# Patient Record
Sex: Male | Born: 1965 | Race: Asian | Hispanic: No | Marital: Single | Smoking: Light tobacco smoker
Health system: Southern US, Community
[De-identification: ages and names within clinical notes are randomized; demographics above are authoritative.]

## PROBLEM LIST (undated history)

## (undated) DIAGNOSIS — I10 Essential (primary) hypertension: Secondary | ICD-10-CM

## (undated) DIAGNOSIS — Z992 Dependence on renal dialysis: Secondary | ICD-10-CM

## (undated) DIAGNOSIS — H9190 Unspecified hearing loss, unspecified ear: Secondary | ICD-10-CM

## (undated) DIAGNOSIS — N186 End stage renal disease: Secondary | ICD-10-CM

## (undated) DIAGNOSIS — R7611 Nonspecific reaction to tuberculin skin test without active tuberculosis: Secondary | ICD-10-CM

## (undated) DIAGNOSIS — L709 Acne, unspecified: Secondary | ICD-10-CM

## (undated) DIAGNOSIS — J189 Pneumonia, unspecified organism: Secondary | ICD-10-CM

## (undated) DIAGNOSIS — Z86010 Personal history of colonic polyps: Secondary | ICD-10-CM

## (undated) DIAGNOSIS — D649 Anemia, unspecified: Secondary | ICD-10-CM

## (undated) DIAGNOSIS — F32A Depression, unspecified: Secondary | ICD-10-CM

## (undated) DIAGNOSIS — F419 Anxiety disorder, unspecified: Secondary | ICD-10-CM

## (undated) DIAGNOSIS — I499 Cardiac arrhythmia, unspecified: Secondary | ICD-10-CM

## (undated) DIAGNOSIS — F329 Major depressive disorder, single episode, unspecified: Secondary | ICD-10-CM

## (undated) DIAGNOSIS — A159 Respiratory tuberculosis unspecified: Secondary | ICD-10-CM

## (undated) DIAGNOSIS — N289 Disorder of kidney and ureter, unspecified: Secondary | ICD-10-CM

## (undated) HISTORY — DX: Personal history of colonic polyps: Z86.010

## (undated) HISTORY — DX: Anxiety disorder, unspecified: F41.9

## (undated) HISTORY — DX: Cardiac arrhythmia, unspecified: I49.9

## (undated) HISTORY — DX: Acne, unspecified: L70.9

## (undated) HISTORY — DX: Major depressive disorder, single episode, unspecified: F32.9

## (undated) HISTORY — PX: OTHER SURGICAL HISTORY: SHX169

## (undated) HISTORY — DX: Depression, unspecified: F32.A

## (undated) HISTORY — DX: Pneumonia, unspecified organism: J18.9

---

## 2012-03-20 HISTORY — PX: LIPOMA EXCISION: SHX5283

## 2013-09-24 ENCOUNTER — Other Ambulatory Visit: Payer: Self-pay

## 2013-09-25 ENCOUNTER — Encounter (HOSPITAL_COMMUNITY): Payer: Self-pay | Admitting: Emergency Medicine

## 2013-09-25 DIAGNOSIS — I120 Hypertensive chronic kidney disease with stage 5 chronic kidney disease or end stage renal disease: Principal | ICD-10-CM | POA: Diagnosis present

## 2013-09-25 DIAGNOSIS — H9190 Unspecified hearing loss, unspecified ear: Secondary | ICD-10-CM | POA: Diagnosis present

## 2013-09-25 DIAGNOSIS — D631 Anemia in chronic kidney disease: Secondary | ICD-10-CM | POA: Diagnosis present

## 2013-09-25 DIAGNOSIS — E8779 Other fluid overload: Secondary | ICD-10-CM | POA: Diagnosis present

## 2013-09-25 DIAGNOSIS — I498 Other specified cardiac arrhythmias: Secondary | ICD-10-CM | POA: Diagnosis present

## 2013-09-25 DIAGNOSIS — N039 Chronic nephritic syndrome with unspecified morphologic changes: Secondary | ICD-10-CM

## 2013-09-25 DIAGNOSIS — N186 End stage renal disease: Secondary | ICD-10-CM | POA: Diagnosis present

## 2013-09-25 DIAGNOSIS — R7611 Nonspecific reaction to tuberculin skin test without active tuberculosis: Secondary | ICD-10-CM | POA: Diagnosis present

## 2013-09-25 DIAGNOSIS — D696 Thrombocytopenia, unspecified: Secondary | ICD-10-CM | POA: Diagnosis present

## 2013-09-25 DIAGNOSIS — E875 Hyperkalemia: Secondary | ICD-10-CM | POA: Diagnosis present

## 2013-09-25 NOTE — ED Notes (Signed)
Pt speak Burmese, dialect is Matu. Family with pt

## 2013-09-25 NOTE — ED Notes (Signed)
Pt just flew in from [place]. Pt was transported from airport via EMS strictly for transport. Pt is a dialysis pt and seeking further treatment in the states as a refugee. A representative from the [employer] will be coming to see pt in ED and explain in further detail pt's needs. Pt has MD from [place] at bedside. Pt received dialysis yesterday and one unit of PRBC. Pt is [place]ell apparent distress.

## 2013-09-26 ENCOUNTER — Inpatient Hospital Stay (HOSPITAL_COMMUNITY): Payer: Medicaid Other

## 2013-09-26 ENCOUNTER — Inpatient Hospital Stay (HOSPITAL_COMMUNITY)
Admission: EM | Admit: 2013-09-26 | Discharge: 2013-10-09 | DRG: 673 | Disposition: A | Discharge status: Home / Self Care | Payer: Medicaid Other | Attending: Internal Medicine | Admitting: Internal Medicine

## 2013-09-26 ENCOUNTER — Encounter (HOSPITAL_COMMUNITY): Payer: Self-pay | Admitting: Internal Medicine

## 2013-09-26 DIAGNOSIS — I10 Essential (primary) hypertension: Secondary | ICD-10-CM

## 2013-09-26 DIAGNOSIS — H9190 Unspecified hearing loss, unspecified ear: Secondary | ICD-10-CM

## 2013-09-26 DIAGNOSIS — E875 Hyperkalemia: Secondary | ICD-10-CM | POA: Diagnosis present

## 2013-09-26 DIAGNOSIS — R1011 Right upper quadrant pain: Secondary | ICD-10-CM

## 2013-09-26 DIAGNOSIS — I498 Other specified cardiac arrhythmias: Secondary | ICD-10-CM

## 2013-09-26 DIAGNOSIS — D696 Thrombocytopenia, unspecified: Secondary | ICD-10-CM

## 2013-09-26 DIAGNOSIS — R7611 Nonspecific reaction to tuberculin skin test without active tuberculosis: Secondary | ICD-10-CM | POA: Diagnosis present

## 2013-09-26 DIAGNOSIS — N186 End stage renal disease: Secondary | ICD-10-CM

## 2013-09-26 DIAGNOSIS — I120 Hypertensive chronic kidney disease with stage 5 chronic kidney disease or end stage renal disease: Secondary | ICD-10-CM | POA: Diagnosis present

## 2013-09-26 DIAGNOSIS — D649 Anemia, unspecified: Secondary | ICD-10-CM

## 2013-09-26 DIAGNOSIS — D638 Anemia in other chronic diseases classified elsewhere: Secondary | ICD-10-CM | POA: Diagnosis present

## 2013-09-26 DIAGNOSIS — R001 Bradycardia, unspecified: Secondary | ICD-10-CM | POA: Diagnosis present

## 2013-09-26 DIAGNOSIS — E8779 Other fluid overload: Secondary | ICD-10-CM | POA: Diagnosis present

## 2013-09-26 DIAGNOSIS — H9191 Unspecified hearing loss, right ear: Secondary | ICD-10-CM | POA: Diagnosis present

## 2013-09-26 DIAGNOSIS — Z992 Dependence on renal dialysis: Secondary | ICD-10-CM

## 2013-09-26 DIAGNOSIS — H9192 Unspecified hearing loss, left ear: Secondary | ICD-10-CM | POA: Diagnosis present

## 2013-09-26 DIAGNOSIS — A159 Respiratory tuberculosis unspecified: Secondary | ICD-10-CM | POA: Diagnosis present

## 2013-09-26 DIAGNOSIS — D631 Anemia in chronic kidney disease: Secondary | ICD-10-CM | POA: Diagnosis present

## 2013-09-26 HISTORY — DX: Respiratory tuberculosis unspecified: A15.9

## 2013-09-26 HISTORY — DX: Anemia, unspecified: D64.9

## 2013-09-26 HISTORY — DX: End stage renal disease: N18.6

## 2013-09-26 HISTORY — DX: Nonspecific reaction to tuberculin skin test without active tuberculosis: R76.11

## 2013-09-26 HISTORY — DX: Essential (primary) hypertension: I10

## 2013-09-26 HISTORY — DX: Disorder of kidney and ureter, unspecified: N28.9

## 2013-09-26 HISTORY — DX: Unspecified hearing loss, unspecified ear: H91.90

## 2013-09-26 HISTORY — DX: Dependence on renal dialysis: Z99.2

## 2013-09-26 LAB — HEPATIC FUNCTION PANEL
ALK PHOS: 53 U/L (ref 39–117)
ALT: 9 U/L (ref 0–53)
AST: 8 U/L (ref 0–37)
Albumin: 3.5 g/dL (ref 3.5–5.2)
BILIRUBIN TOTAL: 0.4 mg/dL (ref 0.3–1.2)
Bilirubin, Direct: <0.2 mg/dL (ref 0.0–0.3)
TOTAL PROTEIN: 6.6 g/dL (ref 6.0–8.3)

## 2013-09-26 LAB — HEPATITIS PANEL, ACUTE
HCV Ab: NEGATIVE
HEP B C IGM: NONREACTIVE
Hep A IgM: NONREACTIVE
Hepatitis B Surface Ag: NEGATIVE

## 2013-09-26 LAB — RENAL FUNCTION PANEL
ALBUMIN: 3.2 g/dL — AB (ref 3.5–5.2)
Anion gap: 21 — ABNORMAL HIGH (ref 5–15)
BUN: 62 mg/dL — AB (ref 6–23)
CALCIUM: 9.9 mg/dL (ref 8.4–10.5)
CHLORIDE: 101 meq/L (ref 96–112)
CO2: 20 mEq/L (ref 19–32)
Creatinine, Ser: 12.04 mg/dL — ABNORMAL HIGH (ref 0.50–1.35)
GFR calc Af Amer: 5 mL/min — ABNORMAL LOW (ref >90)
GFR calc non Af Amer: 4 mL/min — ABNORMAL LOW (ref >90)
Glucose, Bld: 69 mg/dL — ABNORMAL LOW (ref 70–99)
Phosphorus: 5.7 mg/dL — ABNORMAL HIGH (ref 2.3–4.6)
Potassium: 5 mEq/L (ref 3.7–5.3)
Sodium: 142 mEq/L (ref 137–147)

## 2013-09-26 LAB — CBC
HCT: 26.6 % — ABNORMAL LOW (ref 39.0–52.0)
HCT: 25.3 % — ABNORMAL LOW (ref 39.0–52.0)
Hemoglobin: 8.8 g/dL — ABNORMAL LOW (ref 13.0–17.0)
Hemoglobin: 8.3 g/dL — ABNORMAL LOW (ref 13.0–17.0)
MCH: 29 pg (ref 26.0–34.0)
MCH: 28.4 pg (ref 26.0–34.0)
MCHC: 33.1 g/dL (ref 30.0–36.0)
MCHC: 32.8 g/dL (ref 30.0–36.0)
MCV: 87.8 fL (ref 78.0–100.0)
MCV: 86.6 fL (ref 78.0–100.0)
Platelets: 91 10*3/uL — ABNORMAL LOW (ref 150–400)
Platelets: 94 10*3/uL — ABNORMAL LOW (ref 150–400)
RBC: 3.03 MIL/uL — ABNORMAL LOW (ref 4.22–5.81)
RBC: 2.92 MIL/uL — ABNORMAL LOW (ref 4.22–5.81)
RDW: 15.4 % (ref 11.5–15.5)
RDW: 14.9 % (ref 11.5–15.5)
WBC: 6.2 10*3/uL (ref 4.0–10.5)
WBC: 6 10*3/uL (ref 4.0–10.5)

## 2013-09-26 LAB — BASIC METABOLIC PANEL
Anion gap: 18 — ABNORMAL HIGH (ref 5–15)
BUN: 57 mg/dL — AB (ref 6–23)
CALCIUM: 10.3 mg/dL (ref 8.4–10.5)
CO2: 21 meq/L (ref 19–32)
CREATININE: 11.1 mg/dL — AB (ref 0.50–1.35)
Chloride: 101 mEq/L (ref 96–112)
GFR calc Af Amer: 5 mL/min — ABNORMAL LOW (ref >90)
GFR calc non Af Amer: 5 mL/min — ABNORMAL LOW (ref >90)
GLUCOSE: 81 mg/dL (ref 70–99)
Potassium: 5.6 mEq/L — ABNORMAL HIGH (ref 3.7–5.3)
Sodium: 140 mEq/L (ref 137–147)

## 2013-09-26 LAB — IRON AND TIBC
IRON: 103 ug/dL (ref 42–135)
SATURATION RATIOS: 54 % (ref 20–55)
TIBC: 190 ug/dL — AB (ref 215–435)
UIBC: 87 ug/dL — ABNORMAL LOW (ref 125–400)

## 2013-09-26 LAB — FERRITIN: Ferritin: 938 ng/mL — ABNORMAL HIGH (ref 22–322)

## 2013-09-26 LAB — HIV ANTIBODY (ROUTINE TESTING W REFLEX): HIV 1&2 Ab, 4th Generation: NONREACTIVE

## 2013-09-26 MED ORDER — SODIUM CHLORIDE 0.9 % IJ SOLN
3.0000 mL | Freq: Two times a day (BID) | INTRAMUSCULAR | Status: DC
  Administered 2013-09-26 – 2013-10-09 (×23): 3 mL via INTRAVENOUS

## 2013-09-26 MED ORDER — SODIUM CHLORIDE 0.9 % IV SOLN: 100.0000 mL | INTRAVENOUS | Status: DC | PRN

## 2013-09-26 MED ORDER — LIDOCAINE HCL (PF) 1 % IJ SOLN: 5.0000 mL | INTRAMUSCULAR | Status: DC | PRN

## 2013-09-26 MED ORDER — NEPRO/CARBSTEADY PO LIQD: 237.0000 mL | ORAL | Status: DC | PRN

## 2013-09-26 MED ORDER — LIDOCAINE-PRILOCAINE 2.5-2.5 % EX CREA: 1.0000 "application " | TOPICAL_CREAM | CUTANEOUS | Status: DC | PRN

## 2013-09-26 MED ORDER — AMLODIPINE BESYLATE 5 MG PO TABS
10.0000 mg | ORAL_TABLET | Freq: Once | ORAL | Status: AC
  Administered 2013-09-26: 10 mg via ORAL
  Filled 2013-09-26: qty 2

## 2013-09-26 MED ORDER — DARBEPOETIN ALFA-POLYSORBATE 60 MCG/0.3ML IJ SOLN
60.0000 ug | INTRAMUSCULAR | Status: DC
  Filled 2013-09-26: qty 0.3

## 2013-09-26 MED ORDER — HEPARIN SODIUM (PORCINE) 5000 UNIT/ML IJ SOLN
5000.0000 [IU] | Freq: Three times a day (TID) | INTRAMUSCULAR | Status: DC
  Filled 2013-09-26 (×3): qty 1

## 2013-09-26 MED ORDER — HYDRALAZINE HCL 20 MG/ML IJ SOLN: 10.0000 mg | Freq: Once | INTRAMUSCULAR | Status: DC

## 2013-09-26 MED ORDER — HEPARIN SODIUM (PORCINE) 1000 UNIT/ML DIALYSIS
1000.0000 [IU] | INTRAMUSCULAR | Status: DC | PRN
  Filled 2013-09-26: qty 1

## 2013-09-26 MED ORDER — HEPARIN SODIUM (PORCINE) 1000 UNIT/ML DIALYSIS
2000.0000 [IU] | INTRAMUSCULAR | Status: DC | PRN
  Filled 2013-09-26: qty 2

## 2013-09-26 MED ORDER — ACETAMINOPHEN 650 MG RE SUPP: 650.0000 mg | Freq: Four times a day (QID) | RECTAL | Status: DC | PRN

## 2013-09-26 MED ORDER — ALTEPLASE 2 MG IJ SOLR
2.0000 mg | Freq: Once | INTRAMUSCULAR | Status: DC | PRN
  Filled 2013-09-26: qty 2

## 2013-09-26 MED ORDER — ACETAMINOPHEN 325 MG PO TABS
650.0000 mg | ORAL_TABLET | Freq: Four times a day (QID) | ORAL | Status: DC | PRN
  Administered 2013-09-27 – 2013-10-02 (×4): 650 mg via ORAL
  Filled 2013-09-26 (×3): qty 2

## 2013-09-26 MED ORDER — HYDRALAZINE HCL 20 MG/ML IJ SOLN
5.0000 mg | Freq: Four times a day (QID) | INTRAMUSCULAR | Status: DC | PRN
  Administered 2013-09-26: 5 mg via INTRAVENOUS
  Filled 2013-09-26: qty 1

## 2013-09-26 MED ORDER — PENTAFLUOROPROP-TETRAFLUOROETH EX AERO: 1.0000 "application " | INHALATION_SPRAY | CUTANEOUS | Status: DC | PRN

## 2013-09-26 MED ORDER — TUBERCULIN PPD 5 UNIT/0.1ML ID SOLN
5.0000 [IU] | Freq: Once | INTRADERMAL | Status: AC
  Administered 2013-09-26: 5 [IU] via INTRADERMAL
  Filled 2013-09-26: qty 0.1

## 2013-09-26 NOTE — ED Notes (Signed)
Lab to add on HFP

## 2013-09-26 NOTE — ED Notes (Signed)
While speaking with the pt, his heart rate increased to 63. When resting the pt's heart rate decreases back down to mid 40's.

## 2013-09-26 NOTE — Progress Notes (Signed)
Admission note: Patient came from ED on stretcher accompanied by two staff members. Mental Orientation: A &O x 4 Telemetry: Patient is on cardiac monitoring and CCMD notified. Assessment: See doc flowsheets. Skin:  Warm, dry and intact.  IV: Peripheral IV on Right AC. Pain: Denied any pain.  Showed the faces chart. Safety Measures, Fall Prevention Safety Plan, and admission Screening unable to finish due to language barrier and also patient being in dialysis. 6700 Orientation: Patient has been oriented to the unit, staff and to the room.

## 2013-09-26 NOTE — ED Notes (Signed)
Pain left shoulder, speaks Matu (Bermeise), Ronny Bacon (emergency contact),  Last HD Wednesday.  Pt has been SB 42--Pre-flight screening makes him eligible for Assisted Living and will need a physician to state whether he needs AL or he can stay with friends.

## 2013-09-26 NOTE — Progress Notes (Signed)
Patient speaks Matupi but also Burmese and understands phone Burmese interpreter well.

## 2013-09-26 NOTE — ED Notes (Signed)
PT contact Keith Vaughan [phone]

## 2013-09-26 NOTE — H&P (Signed)
Date: 09/26/2013               Patient Name:  Keith Vaughan MRN: [id number]  DOB: 08/07/1965 Age / Sex: 48 y.o., male   PCP: Bartolini Pcp Per Patient         Medical Service: Internal Medicine Teaching Service         Attending Physician: Dr. Karren Cobble, MD    First Contact: Dr. Arcelia Jew Pager: [phone]  Second Contact: Dr. Algis Liming Pager: [phone]       After Hours (After 5p/  First Contact Pager: [phone]  weekends / holidays): Second Contact Pager: [phone]   Chief Complaint: Need dialysis  History of Present Illness:  Keith Vaughan is a 48 year old man Burmese and [place] speaker with PMH of HTN, ESRD on HD 3 times per week, who immigrated last night from [place] via [place] where he had been a refugee since 2006. The information in this H&P comes from interview with him with a Burmese phone interpreter, from medical chart sent from [place], and from the ED provider. He has ESRD suspected to be secondary to hypertension and has been on dialysis since December 2014. Last night he was brought [facility] from the airport via EMS shortly after arriving in the [place]. He was last dialyzed on 7/9 in [place] and received 2 units of pRBC at that time. Per the ED notes, it appears that this organization is willing to help support Keith Vaughan while he receives dialysis.    He was bradycardic in the ED but is asymptomatic. He complained of right neck pain which he attributed to neck strain from sitting in the airplane. He has paternal relatives here in [place] and wishes to establish permanent residence here but he is not planning to live with his relatives.   In reviewing his records from [place], he has been evaluated as recent as July 6th and was found to have Hgb of 6.6 at that time. He was evaluated by Dr. Juanda Crumble from the [employer] and was deemed independent of his ADLs and fit to travel on July 9th.   Dr. Jimmy Footman, in Nephrology was contacted by the ED physician and  recommended that the patient be admitted to the IMTS for dialysis, placement of permanent HD access, and possible placement in ALF.   Medications prior to his admission:  Enalapril 10mg  1 tablet PO daily Amlodipine 10mg  1 tablet PO daily Calcium carbonate 500mg  tablet PO TID Ferrous Fumarate 200mg  tablet BID Folic acid 5mg  tablet PO daily Vitamin C 100mg  tablet PO daily Vitamin B Complex 1 tablet daily   Meds: Current Facility-Administered Medications  Medication Dose Route Frequency Provider Last Rate Last Dose  . 0.9 %  sodium chloride infusion  100 mL Intravenous PRN Jay K. [name], MD      . 0.9 %  sodium chloride infusion  100 mL Intravenous PRN Ulice Dash K. [name], MD      . acetaminophen (TYLENOL) tablet 650 mg  650 mg Oral Q6H PRN Blain Pais, MD       Or  . acetaminophen (TYLENOL) suppository 650 mg  650 mg Rectal Q6H PRN Blain Pais, MD      . alteplase (CATHFLO ACTIVASE) injection 2 mg  2 mg Intracatheter Once PRN Ulice Dash K. [name], MD      . darbepoetin [facility]) injection 60 mcg  60 mcg Intravenous Q Fri-HD Jay K. [name], MD      . feeding supplement (  NEPRO CARB STEADY) liquid 237 mL  237 mL Oral PRN Ulice Dash K. [name], MD      . heparin injection 1,000 Units  1,000 Units Dialysis PRN [name]. [name], MD      . heparin injection 2,000 Units  2,000 Units Dialysis PRN [name]. [name], MD      . heparin injection 5,000 Units  5,000 Units Subcutaneous 3 times per day Blain Pais, MD      . lidocaine (PF) (XYLOCAINE) 1 % injection 5 mL  5 mL Intradermal PRN [name]. [name], MD      . lidocaine-prilocaine (EMLA) cream 1 application  1 application Topical PRN Ulice Dash K. [name], MD      . pentafluoroprop-tetrafluoroeth Landry Dyke) aerosol 1 application  1 application Topical PRN [name]. [name], MD      . sodium chloride 0.9 % injection 3 mL  3 mL Intravenous Q12H Blain Pais, MD      . tuberculin injection 5 Units  5 Units Intradermal Once Blain Pais, MD         Allergies: Allergies as of 09/25/2013  . ([place]earn Known Allergies)   Past Medical History  Diagnosis Date  . Dialysis patient   . Hypertension   . Anemia   . S/P hemodialysis catheter insertion     Right Internal Jugular   . Hearing loss   . Renal disorder   . ESRF (end stage renal failure)    History reviewed. [place] pertinent past surgical history. History reviewed. [place] pertinent family history. History   Social History  . Marital Status: Single    Spouse Name: N/A    Number of Children: N/A  . Years of Education: N/A   Occupational History  . Not on file.   Social History Main Topics  . Smoking status: Former Research scientist (life sciences)  . Smokeless tobacco: Never Used  . Alcohol Use: [place]ribner  . Drug Use: [place]  . Sexual Activity: Not on file   Other Topics Concern  . Not on file   Social History Narrative  . Vanderslice narrative on file    Review of Systems: Review of Systems  Constitutional: Positive for chills. Negative for fever, weight loss, malaise/fatigue and diaphoresis.  HENT: Positive for hearing loss. Negative for congestion.   Eyes: Negative for blurred vision.  Respiratory: Negative for cough, sputum production and shortness of breath.   Cardiovascular: Positive for leg swelling. Negative for chest pain and palpitations.  Gastrointestinal: Negative for nausea, vomiting, abdominal pain, diarrhea, constipation and blood in stool.  Genitourinary: Negative for dysuria.  Musculoskeletal: Positive for myalgias.       Right upper back/neck pain  Skin: Negative for rash.  Neurological: Negative for dizziness, focal weakness, weakness and headaches.  Psychiatric/Behavioral: Negative for depression.    Physical Exam: Blood pressure 177/96, pulse 56, resp. rate 15, SpO2 99.00%. Physical Exam  Nursing note and vitals reviewed. Constitutional: He is oriented to person, place, and time. He appears well-developed and well-nourished. Behrend distress.  Appears much younger than stated age. Tired  appearing. Conversing in [place]:  Head: Normocephalic and atraumatic.  Mouth/Throat: Oropharynx is clear and moist. Hagger oropharyngeal exudate.  Poor dentition. Dental staining 2/2 hx of betel nut chewing.  Right IJ HD catheter in place, covered with dressing that is c/d/i, with Messmer surrounding edema or erythema.    Eyes: Conjunctivae and EOM are normal. Pupils are equal, round, and reactive to light. Right eye exhibits Houchins discharge. Left eye exhibits Igou  discharge. Mcdougle scleral icterus.  Neck: Normal range of motion. Neck supple.  Cardiovascular: Normal rate and regular rhythm.   Pantaleon murmur heard. Respiratory: Effort normal and breath sounds normal. Sen respiratory distress. He has Escajeda wheezes. He has Detert rales.  GI: Soft. Bowel sounds are normal. He exhibits Stryker distension. There is tenderness. There is Baxley rebound and Basque guarding.  RUQ tenderness to deep palpation.   Musculoskeletal: Normal range of motion. He exhibits Endres edema and Kutner tenderness.  Neurological: He is alert and oriented to person, place, and time. Bonfiglio cranial nerve deficit.  Strength 5/5 in UE and LE  Skin: Skin is warm and dry. Drenning rash noted. He is not diaphoretic. Carvin erythema. Virgo pallor.  Tattoos noted in left forearm and both arms  Psychiatric: He has a normal mood and affect.     Lab results: Basic Metabolic Panel:  Recent Labs  09/26/13 0504  NA 140  K 5.6*  CL 101  CO2 21  GLUCOSE 81  BUN 57*  CREATININE 11.10*  CALCIUM 10.3   Liver Function Tests:  Recent Labs  09/26/13 0504  AST 8  ALT 9  ALKPHOS 53  BILITOT 0.4  PROT 6.6  ALBUMIN 3.5    CBC:  Recent Labs  09/26/13 0504  WBC 6.2  HGB 8.8*  HCT 26.6*  MCV 87.8  PLT 91*     Imaging results:  Dg Chest 2 View  09/26/2013   CLINICAL DATA:  Eval for HD cath  EXAM: CHEST  2 VIEW  COMPARISON:  None.  FINDINGS: Low lung volumes. Cardiac silhouette is enlarged. A right internal jugular sheath is appreciated tip superior vena cava. Coffie  pneumothorax. There is diffuse prominence of interstitial markings and central peribronchial cuffing. Mesa focal region of consolidation nor focal infiltrates. Bernet acute osseous abnormalities.  IMPRESSION: Interstitial findings consistent with pulmonary edema.  Deshaies focal regions of consolidation.  Demmer pneumothorax.  Cardiomegaly.   Electronically Signed   By: [name] M.D.   On: 09/26/2013 11:31   US Abdomen Complete  09/26/2013   CLINICAL DATA:  Eval RUQ, history of end-stage renal disease receiving dialysis  EXAM: ULTRASOUND ABDOMEN COMPLETE  COMPARISON:  None.  FINDINGS: Gallbladder:  Eunice gallstones. Gallbladder wall is slightly prominent measuring 3.3 mm in thickness. Gruhn pericholecystic fluid nor a sonographic Murphy sign.  Common bile duct:  Diameter: 4.4 mm  Liver:  Pischke focal lesion identified. Within normal limits in parenchymal echogenicity.  IVC:  Girardin abnormality visualized.  Pancreas:  Visualized portion unremarkable.  Spleen:  Size and appearance within normal limits.  Right Kidney:  Length: 7.6 cm. Decreased corticomedullary differentiation and diffuse increased cortical echogenicity. Moser hydronephrosis. Cormany gross evidence of solid masses.  Left Kidney:  Length: 6.26 cm. Decreased corticomedullary differentiation, diffuse increased cortical echogenicity and Lamba evidence of hydronephrosis. Bose gross evidence of solid masses.  Abdominal aorta:  Mirando aneurysm visualized.  Other findings:  None.  IMPRESSION: Gallbladder wall prominence without further sonographic evidence of cholecystitis. Correlation with biliary function tests recommended if clinically warranted.  Bilateral renal atrophy with sonographic findings consistent with patient's history of end-stage renal disease.   Electronically Signed   By: [name] M.D.   On: 09/26/2013 12:13     Assessment & Plan by Problem: 48 year old man, refugee from [place], [place] speaker, with PMH of ESRD on HD 3 times weekly and HTN presenting to establish  care in [place]  ESRD on HD - The patient is a  refugee from [place], with last HD July 8th prior to his arrival to the [place].Per medical records from [place], his first HD was in December 2014 at [facility] likely due to uncontrolled hypertension. He has been getting HD 3 times per week for 4 hour session with 1.42m2 polysulfone dialyzer, blood flow 343ml/min, heparin 2000 u prime 1000uper hour, HCO2 dialysate and dry weight of 55 Kg. His vascular access has been Right IJ which was placed in December 2014. He was supposed to get AVF but was unable to get surgery due to "small veins". Last HD screening labs in 6/30 drawn at [facility] with HIV 1/2 non reactive, hep B antigen nonreactive, hep B antibody >1000.0 mIU/ml, anti-HCV non reactive. CXR from March 5th 2015 with "right IJ cath in-situ with borderline cardiomegaly". CXR here with Hazzard focal region of consolidation nor focal infiltrates, interstitial findings consistent with pulmonary edema.    -Admit to telemetry -Nephrology consulted, appreciate help with inpatient HD and setting up outpatient HD -Renal diet--pt prefers rice and vegetables and Nemetz beef or pork products -Vascular Surgery consult for AVF placement -Repeat HIV, Hepatitis B, place PPD  HTN - BP as high as 200/116 in the ED but trended down to 177/96. His usual antihypertensive regimen include amlodipine 10mg  daily and enalapril 10mg  daily but it is unclear when he last took his medications. Volume overload with Pulmonary edema per CXR likely also worsening his HTN.  -Continue amlodipine 10mg  daily  -Hydralazine 5mg  PRN for SPB >180  Abdominal tenderness, RUQ - Mild. Abdominal US with negative Murphy's sign, normal liver, pancreas (partially visualized), and common bile duct but with gallbladder wall prominence without further sonographic evidence of cholecystitis. He denies N/V/D and is tolerating food well. He is afebrile with Baxendale leucocytosis.  Cholecystitis unlikely at this time.  -Continue monitoring  Anemia of chronic disease - Last Hg on 7/6 at 6.6, per [place] medical report it appears that he had been receiving Erythropoietin 6000 U once per week since Jun 22 and received 2 units of pRBC during his last HD session in July 9th. His CBC here reveal Hg of 8.8. He is on ferrous fumarate oral supplementation as well.  -CBC daily -Will follow Nephrology recs on iron supplementation  Thrombocytopenia - Chronic issue for him. Platelets of 91 on presentation, reported as low as 50s with clumping. Grimley bleeding noted.  -CBC daily  Bradycardia - HR of 40s in the ED but increased to 50s. Patient asymptomatic. Not on BB. EKG still pending -follow up on EKG STAT -Telemetry  Hearing loss - Stable. He has reported severe right ear hearing loss and mild left ear hearing loss for over 10 years. He does not have hearing aides.  -May need outpatient ENT follow up though financial situation may be a barrier  Refugee status - [place] signs of active TB. PPD to be placed. IOM contact is Leanora Ivanoff [phone]. I have spoke with her and she states that the patient already has Medicaid application in process as well as housing, food, and other community help. She will be in contact with the [facility] social worker and case managers. Prior to his departure from [place], the patient has been deemed independent of his ADLs and will likely not need ALF or SNK placement but we will await formal PT/OT evaluation prior to his housing arrangement is finalized.  -Social work/Care management consult for placement/housing assistance -PT/OT eval  DVT prophylaxis: SCDs  FEN:  NSL BMET daily  Renal diet, Teal beef or pork products   Dispo: Disposition is deferred at this time, awaiting improvement of current medical problems. Anticipated discharge in approximately 7 day(s).   The patient does not have a current PCP and does need an [facility] hospital follow-up appointment  after discharge.  The patient does have transportation limitations that hinder transportation to clinic appointments.  Signed: Blain Pais, MD PGY-3 IMTS 09/26/2013, 2:34 PM

## 2013-09-26 NOTE — ED Notes (Signed)
PT is arousable.  At this time there is only a Architect available.  Pt visibly in [place] active distress.  Right neck HD catheter and capped.  Pt was able to understand and denies pain at this time.  Pt made aware we are waiting on physicians to come and see him

## 2013-09-26 NOTE — ED Notes (Signed)
Pt's heart rate is in the mid 40's. This RN unable to assess if the pt is symptomatic as the pt does not speak [place]. Matu interpreter is unavailable.

## 2013-09-26 NOTE — ED Notes (Signed)
Called floor for report. RN with another PT - CB 107min

## 2013-09-26 NOTE — ED Notes (Signed)
Pt receives his dialysis through his neck. Pt also reports a hx of HTN, and that he takes a medication for it.

## 2013-09-26 NOTE — ED Provider Notes (Signed)
CSN: [id number]     Arrival date & time 09/25/13  2254 History   First MD Initiated Contact with Patient 09/26/13 [phone]     Chief Complaint  Patient presents with  . Follow-up     (Consider location/radiation/quality/duration/timing/severity/associated sxs/prior Treatment) HPI This patient is a 48 yo man who immigrated this evening from [place] via [place] where he has been a refugee since 2006. He has ESRD secondary to HTN an has been a dialysis patient since 2014. He was brought to the ED via EMS directly from the airport after arriving in the [place]. He was last dialyzed yesterday in [place]. He is without complaints. Denies SOB. Apparently, he received a unit of PRBC at last dialyses. HE has a right chest permacath.    Past Medical History  Diagnosis Date  . Dialysis patient   . Hypertension   . Anemia   . S/P hemodialysis catheter insertion     Right Internal Jugular   . Hearing loss   . Renal disorder   . ESRF (end stage renal failure)    History reviewed. Bain[place] pertinent past surgical history. History reviewed. Crump[place] pertinent family history. History  Substance Use Topics  . Smoking status: Never Smoker   . Smokeless tobacco: Never Used  . Alcohol Use: Remmel    Review of Systems  Ten point review of symptoms performed and is negative with the exception of symptoms noted above.   Allergies  Review of patient's allergies indicates [place] known allergies.  Home Medications   Prior to Admission medications   Not on File   BP 176/112  Pulse 50  Resp 13  SpO2 100% Physical Exam Gen: well developed and well nourished appearing Head: NCAT Eyes: PERL, EOMI Nose: Schey epistaixis or rhinorrhea Mouth/throat: mucosa is moist and pink Neck: supple, Kumpf stridor Lungs: CTA B, Ceniceros wheezing, rhonchi or rales CV: RRR, Wacker murmur, extremities appear well perfused.  Abd: soft, notender, nondistended Back: Syfert ttp, Gest cva ttp Skin: warm and dry Ext: normal to inspection, Scarber dependent  edema Neuro: CN ii-xii grossly intact, Keshishyan focal deficits Psyche; normal affect,  calm and cooperative.   ED Course  Procedures (including critical care time) Labs Review  Results for orders placed during the hospital encounter of 09/26/13 (from the past 24 hour(s))  BASIC METABOLIC PANEL     Status: Abnormal   Collection Time    09/26/13  5:04 AM      Result Value Ref Range   Sodium 140  137 - 147 mEq/L   Potassium 5.6 (*) 3.7 - 5.3 mEq/L   Chloride 101  96 - 112 mEq/L   CO2 21  19 - 32 mEq/L   Glucose, Bld 81  70 - 99 mg/dL   BUN 57 (*) 6 - 23 mg/dL   Creatinine, Ser 11.10 (*) 0.50 - 1.35 mg/dL   Calcium 10.3  8.4 - 10.5 mg/dL   GFR calc non Af Amer 5 (*) >90 mL/min   GFR calc Af Amer 5 (*) >90 mL/min   Anion gap 18 (*) 5 - 15  CBC     Status: Abnormal   Collection Time    09/26/13  5:04 AM      Result Value Ref Range   WBC 6.2  4.0 - 10.5 K/uL   RBC 3.03 (*) 4.22 - 5.81 MIL/uL   Hemoglobin 8.8 (*) 13.0 - 17.0 g/dL   HCT 26.6 (*) 39.0 - 52.0 %   MCV 87.8  78.0 -  100.0 fL   MCH 29.0  26.0 - 34.0 pg   MCHC 33.1  30.0 - 36.0 g/dL   RDW 15.4  11.5 - 15.5 %   Platelets 91 (*) 150 - 400 K/uL     MDM  Case discussed with Dr. Dedderding. He recomends that the teaching service admit the patient and he will consult for dialysis. The patient will need permanent access. Case discussed with IM resident.   The patient is not accompanied by family or a case manager of any type. Apparently, there was a case manager from the [facility] with the patient when he first arrived. Her name was recorded as Mollie Germany and her phone number is [phone].     Elyn Peers, MD 09/26/13 [phone]

## 2013-09-26 NOTE — Consult Note (Signed)
Reason for Consult: Continuity of ESRD care-hyperkalemia Referring Physician: Oval Linsey M.D. (IMTS)  HPI:  48 year old man of [place]mese origin who flew into the [place] earlier today and was brought from the airport to the emergency room by EMS for establishment of dialysis care. Reviewing his records, he has been on dialysis for the past 7 months or so (since December 2014) likely from underlying hypertension. The patient does not speak any English and history/complaints are obtained and verified through a translator on the telephone. His main complaint is that of some neck discomfort likely from his catheter/neck positioning during the flight. He also has some right upper quadrant pain on physical exam. He denies any shortness of breath or chest pain. Denies any nausea, vomiting or diarrhea.   Past Medical History  Diagnosis Date  . Dialysis patient   . Hypertension   . Anemia   . S/P hemodialysis catheter insertion     Right Internal Jugular   . Hearing loss   . Renal disorder   . ESRF (end stage renal failure)     History reviewed. Polakowski pertinent past surgical history.  History reviewed. Pike pertinent family history.  Social History:  reports that he has never smoked. He has never used smokeless tobacco. He reports that he does not drink alcohol or use illicit drugs.  Allergies: Milhouse Known Allergies  Medications: I have reviewed the patient's current medications.  Results for orders placed during the hospital encounter of 09/26/13 (from the past 48 hour(s))  BASIC METABOLIC PANEL     Status: Abnormal   Collection Time    09/26/13  5:04 AM      Result Value Ref Range   Sodium 140  137 - 147 mEq/L   Potassium 5.6 (*) 3.7 - 5.3 mEq/L   Chloride 101  96 - 112 mEq/L   CO2 21  19 - 32 mEq/L   Glucose, Bld 81  70 - 99 mg/dL   BUN 57 (*) 6 - 23 mg/dL   Creatinine, Ser 11.10 (*) 0.50 - 1.35 mg/dL   Calcium 10.3  8.4 - 10.5 mg/dL   GFR calc non Af Amer 5 (*) >90 mL/min   GFR  calc Af Amer 5 (*) >90 mL/min   Comment: (NOTE)     The eGFR has been calculated using the CKD EPI equation.     This calculation has not been validated in all clinical situations.     eGFR's persistently <90 mL/min signify possible Chronic Kidney     Disease.   Anion gap 18 (*) 5 - 15  CBC     Status: Abnormal   Collection Time    09/26/13  5:04 AM      Result Value Ref Range   WBC 6.2  4.0 - 10.5 K/uL   RBC 3.03 (*) 4.22 - 5.81 MIL/uL   Hemoglobin 8.8 (*) 13.0 - 17.0 g/dL   HCT 26.6 (*) 39.0 - 52.0 %   MCV 87.8  78.0 - 100.0 fL   MCH 29.0  26.0 - 34.0 pg   MCHC 33.1  30.0 - 36.0 g/dL   RDW 15.4  11.5 - 15.5 %   Platelets 91 (*) 150 - 400 K/uL   Comment: PLATELET COUNT CONFIRMED BY SMEAR    Iorio results found.  Review of Systems  Unable to perform ROS: language   Blood pressure 178/88, pulse 60, resp. rate 14, SpO2 100.00%. Physical Exam  Nursing note and vitals reviewed. Constitutional: He appears well-developed and  well-nourished. Atlas distress.  HENT:  Head: Normocephalic and atraumatic.  Nose: Nose normal.  Mouth/Throat: Buzan oropharyngeal exudate.  Eyes: Conjunctivae are normal. Pupils are equal, round, and reactive to light. Gatson scleral icterus.  Neck: Normal range of motion. Neck supple. Pembleton JVD present. Murdoch tracheal deviation present. Yoshino thyromegaly present.  Right IJ temporary dialysis catheter  Cardiovascular: Normal rate, regular rhythm and normal heart sounds.  Exam reveals Pinckney friction rub.   Redler murmur heard. Respiratory: Effort normal and breath sounds normal. Hargrove respiratory distress. He has Guercio wheezes. He has Lepore rales.  GI: Soft. Bowel sounds are normal. He exhibits Nevils distension. There is tenderness.  RUQ tenderness  Musculoskeletal: Normal range of motion. He exhibits Thomason edema and Feick tenderness.  Neurological: He is alert. Kinnison cranial nerve deficit.  Skin: Skin is warm and dry. Ketterman rash noted. Tomko erythema.    Assessment/Plan: 1. End-stage renal disease:  Hemodialysis ordered for today. Surprisingly, he has a temporary right IJ dialysis catheter through which he has been getting dialysis for the past 7 months. I have consulted vascular surgery for permanent dialysis access and conversion of this temporary catheter to a permacath. The difficult situation indeed as we await for placement to outpatient dialysis unit. Hepatitis studies, HIV, PPD and chest x-ray will be done. 2. Hyperkalemia: Borderline elevated, needs education on renal diet and hemodialysis today. 3. Hypertension: We'll start antihypertensive therapy in conjunction with ultrafiltration on hemodialysis. 4. Anemia of chronic kidney disease: Iron stores will be checked and the patient was started on ESA 5. Thrombocytopenia: In a patient with his demographics-have a high index of suspicion that in fact has chronic hepatitis/portal hypertension with splenic sequestration versus ITP-pending confirmation with labs 6. Metabolic bone disease: Phosphorus, magnesium and PTH levels to be checked today  Majel Giel K. 09/26/2013, 10:31 AM

## 2013-09-26 NOTE — Clinical Social Work Psychosocial (Signed)
Clinical Social Work Department BRIEF PSYCHOSOCIAL ASSESSMENT 09/27/2013  Patient:  Keith Vaughan, Keith Vaughan     Account Number:  [id number]     Admit date:  09/25/2013  Clinical Social Worker:  Hubert Azure  Date/Time:  09/26/2013 05:30 PM  Referred by:  Physician  Date Referred:  09/26/2013 Referred for  Other - See comment   Other Referral:   Placement   Interview type:  Other - See comment Other interview type:   Database administrator for Migration    PSYCHOSOCIAL DATA Living Status:  OTHER Admitted from facility:   Level of care:   Primary support name:  Leanora Ivanoff Primary support relationship to patient:  NONE Degree of support available:   Good    CURRENT CONCERNS Current Concerns  Post-Acute Placement   Other Concerns:    SOCIAL WORK ASSESSMENT / PLAN CSW contacted patient's operations manager Leanora Ivanoff) for the [employer] for Migration and introduced self and explained role. Per Ronny Bacon, patient just arrived in the [place] the previous night at 9pm, and was brought to [facility]. Per Ronny Bacon, patient has Lacek family back in [place] and has several friends living in [place] from the refugee [place] in which they stayed, however, the  friends are unable to assist him. Ronny Bacon reported patient has a Civil Service fast streamer Audree Camel) and Lanae Boast is the lead case manager for patient. Ronny Bacon stated patient will receive $1100 in re-settlements funds and will qualify for MAD, and is currently in the process of completing Medicaid application. Ronny Bacon stated patient will need ALF placement, but was told MD has to be involved for patient to get placed.   Assessment/plan status:  Information/Referral to [employer] Other assessment/ plan:   CSW to follow-up with medical services coordinator and lead case Freight forwarder.   Information/referral to community resources:   CSW provided [place] with ALF information.    PATIENT'S/FAMILY'S  RESPONSE TO PLAN OF CARE: Ronny Bacon was cooperative and involved in patient's d/c plan.   [place], [place] Weekend Clinical Social Worker [phone]

## 2013-09-26 NOTE — Consult Note (Signed)
[facility]  Reason for Consult:  In need of permanent HD access Referring Physician:  [name] MRN #:  [id number]  History of Present Illness: This is a 48 y.o. male who flew in from [place] this morning and was met with EMS and brought to the ED.  He has a permanent HD catheter that was placed in [place].  He is in need of permanent HD access.  Pt speaks limited English and the above was obtained from Dr. [name].  Past Medical History  Diagnosis Date  . Dialysis patient   . Hypertension   . Anemia   . S/P hemodialysis catheter insertion     Right Internal Jugular   . Hearing loss   . Renal disorder   . ESRF (end stage renal failure)    History reviewed. Galeana pertinent past surgical history.  Mendel Known Allergies  Prior to Admission medications   Not on File    History   Social History  . Marital Status: Single    Spouse Name: N/A    Number of Children: N/A  . Years of Education: N/A   Occupational History  . Not on file.   Social History Main Topics  . Smoking status: Never Smoker   . Smokeless tobacco: Never Used  . Alcohol Use: Phang  . Drug Use: Parmar  . Sexual Activity: Not on file   Other Topics Concern  . Not on file   Social History Narrative  . Jeffords narrative on file    Family History: Unable to obtain due to limited English  ROS: []  Positive   [ ]  Negative   [ ]  All sytems reviewed and are negative Unable to obtain due to limited English  Cardiovascular: []  chest pain/pressure []  palpitations []  SOB lying flat []  DOE []  pain in legs while walking []  pain in legs at rest []  pain in legs at night []  non-healing ulcers []  hx of DVT []  swelling in legs  Pulmonary: []  productive cough []  asthma/wheezing []  home O2  Neurologic: []  weakness in []  arms []  legs []  numbness in []  arms []  legs []  hx of CVA []  mini stroke [] difficulty speaking or slurred speech []  temporary loss of vision in one eye []   dizziness  Hematologic: []  hx of cancer []  bleeding problems []  problems with blood clotting easily  Endocrine:   []  diabetes []  thyroid disease  GI []  vomiting blood []  blood in stool  GU: [x]  CKD/renal failure []  HD--[]  M/W/F or []  T/T/S []  burning with urination []  blood in urine  Psychiatric: []  anxiety []  depression  Musculoskeletal: []  arthritis []  joint pain  Integumentary: []  rashes []  ulcers  Constitutional: []  fever []  chills   Physical Examination  Filed Vitals:   09/26/13 1100  BP: 177/96  Pulse: 56  Resp: 15   There is Viele height or weight on file to calculate BMI.  General:  WDWN in NAD Gait: Not observed HENT: WNL, normocephalic; right perm cath Eyes: Pupils equal Pulmonary: normal non-labored breathing, without Rales, rhonchi,  wheezing Cardiac: regular, without  Murmurs, rubs or gallops; Abdomen: soft, NT/ND, Peak masses Skin: without rashes, without ulcers  Vascular Exam/Pulses:  Right Left  Radial 2+ (normal) 2+ (normal)  Ulnar 2+ (normal) 2+ (normal)  Femoral 2+ (normal) 2+ (normal)  DP 2+ (normal) 2+ (normal)  PT 2+ (normal) 2+ (normal)   Extremities: without ischemic changes, without Gangrene , without cellulitis; without open wounds;  Musculoskeletal: Bottoms muscle wasting or  atrophy  Neurologic: A&O X 3; Appropriate Affect ; SENSATION: normal; MOTOR FUNCTION:  moving all extremities equally. Speech is fluent/normal Psychiatric:  Normal affect Lymph:  Yeh inguinal lymphadenopathy   CBC    Component Value Date/Time   WBC 6.2 09/26/2013 0504   RBC 3.03* 09/26/2013 0504   HGB 8.8* 09/26/2013 0504   HCT 26.6* 09/26/2013 0504   PLT 91* 09/26/2013 0504   MCV 87.8 09/26/2013 0504   MCH 29.0 09/26/2013 0504   MCHC 33.1 09/26/2013 0504   RDW 15.4 09/26/2013 0504    BMET    Component Value Date/Time   NA 140 09/26/2013 0504   K 5.6* 09/26/2013 0504   CL 101 09/26/2013 0504   CO2 21 09/26/2013 0504   GLUCOSE 81 09/26/2013 0504   BUN 57*  09/26/2013 0504   CREATININE 11.10* 09/26/2013 0504   CALCIUM 10.3 09/26/2013 0504   GFRNONAA 5* 09/26/2013 0504   GFRAA 5* 09/26/2013 0504    COAGS: Duarte results found for this basename: INR, PROTIME     Non-Invasive Vascular Imaging:  None--vein mapping has been ordered     ASSESSMENT: This is a 48 y.o. male from [place] with ESRD who is currently dialyzing via perm cath right IJ who is in need of permanent access  PLAN: -will await vein mapping to determine further plan.  Will need interpreter in future to discuss plan with pt once vein mapping is complete.   Leontine Locket, PA-C [facility] [phone]  Agree with above Plan access next week

## 2013-09-26 NOTE — ED Notes (Signed)
Translator phone has been busy for 40 mins. Transporting PT to floor

## 2013-09-26 NOTE — ED Notes (Signed)
PT @ ultrasound

## 2013-09-26 NOTE — Progress Notes (Addendum)
Patient BP 181/111.  MD paged.  Patient scheduled for HD today.  Held PRN BP medication per MD orders before dialysis.

## 2013-09-26 NOTE — Procedures (Signed)
Patient seen on Hemodialysis. QB 400, UF goal 3L Treatment adjusted as needed.  Elmarie Shiley MD [facility]. Office # [phone] Pager # [phone] 5:16 PM

## 2013-09-26 NOTE — ED Notes (Signed)
Dr. Cheri Guppy at the bedside with use of translator via phone.

## 2013-09-27 DIAGNOSIS — Z01810 Encounter for preprocedural cardiovascular examination: Secondary | ICD-10-CM

## 2013-09-27 LAB — CBC
HEMATOCRIT: 26.8 % — AB (ref 39.0–52.0)
HEMOGLOBIN: 8.9 g/dL — AB (ref 13.0–17.0)
MCH: 28.7 pg (ref 26.0–34.0)
MCHC: 33.2 g/dL (ref 30.0–36.0)
MCV: 86.5 fL (ref 78.0–100.0)
Platelets: 101 10*3/uL — ABNORMAL LOW (ref 150–400)
RBC: 3.1 MIL/uL — ABNORMAL LOW (ref 4.22–5.81)
RDW: 14.6 % (ref 11.5–15.5)
WBC: 5.6 10*3/uL (ref 4.0–10.5)

## 2013-09-27 LAB — BASIC METABOLIC PANEL
Anion gap: 16 — ABNORMAL HIGH (ref 5–15)
BUN: 18 mg/dL (ref 6–23)
CO2: 26 meq/L (ref 19–32)
Calcium: 9.3 mg/dL (ref 8.4–10.5)
Chloride: 100 mEq/L (ref 96–112)
Creatinine, Ser: 5.57 mg/dL — ABNORMAL HIGH (ref 0.50–1.35)
GFR calc non Af Amer: 11 mL/min — ABNORMAL LOW (ref >90)
GFR, EST AFRICAN AMERICAN: 12 mL/min — AB (ref >90)
Glucose, Bld: 69 mg/dL — ABNORMAL LOW (ref 70–99)
POTASSIUM: 4.6 meq/L (ref 3.7–5.3)
SODIUM: 142 meq/L (ref 137–147)

## 2013-09-27 LAB — HEPATITIS B SURFACE ANTIGEN: HEP B S AG: NEGATIVE

## 2013-09-27 MED ORDER — HEPATITIS A VACCINE 1440 EL U/ML IM SUSP
1.0000 mL | Freq: Once | INTRAMUSCULAR | Status: AC
  Administered 2013-09-27: 1440 [IU] via INTRAMUSCULAR
  Filled 2013-09-27: qty 1

## 2013-09-27 MED ORDER — LANTHANUM CARBONATE 500 MG PO CHEW
500.0000 mg | CHEWABLE_TABLET | Freq: Three times a day (TID) | ORAL | Status: DC
  Administered 2013-09-27 – 2013-10-09 (×32): 500 mg via ORAL
  Filled 2013-09-27 (×39): qty 1

## 2013-09-27 MED ORDER — HEPATITIS B VAC RECOMBINANT 10 MCG/ML IJ SUSP
1.0000 mL | Freq: Once | INTRAMUSCULAR | Status: AC
  Administered 2013-09-27: 10 ug via INTRAMUSCULAR
  Filled 2013-09-27: qty 1

## 2013-09-27 MED ORDER — DARBEPOETIN ALFA-POLYSORBATE 60 MCG/0.3ML IJ SOLN
INTRAMUSCULAR | Status: AC
  Filled 2013-09-27: qty 0.3

## 2013-09-27 MED ORDER — AMLODIPINE BESYLATE 10 MG PO TABS
10.0000 mg | ORAL_TABLET | Freq: Every day | ORAL | Status: DC
  Administered 2013-09-27 – 2013-10-09 (×11): 10 mg via ORAL
  Filled 2013-09-27 (×14): qty 1

## 2013-09-27 MED ORDER — DARBEPOETIN ALFA-POLYSORBATE 60 MCG/0.3ML IJ SOLN
60.0000 ug | INTRAMUSCULAR | Status: DC
  Administered 2013-09-27: 60 ug via INTRAVENOUS
  Filled 2013-09-27 (×2): qty 0.3

## 2013-09-27 MED ORDER — HEPARIN SODIUM (PORCINE) 1000 UNIT/ML DIALYSIS: 2500.0000 [IU] | INTRAMUSCULAR | Status: DC | PRN

## 2013-09-27 MED ORDER — LISINOPRIL 10 MG PO TABS
10.0000 mg | ORAL_TABLET | Freq: Every day | ORAL | Status: DC
  Administered 2013-09-27 – 2013-10-09 (×11): 10 mg via ORAL
  Filled 2013-09-27 (×13): qty 1

## 2013-09-27 NOTE — Progress Notes (Signed)
Subjective: Interview was done with Burmese phone interpreter. Patient reports he is feeling better today. He complains of mild abdominal tenderness only during palpation.   Objective: Vital signs in last 24 hours: Filed Vitals:   09/26/13 1850 09/26/13 2058 09/26/13 2254 09/27/13 0455  BP: 181/107 183/103 169/86 177/99  Pulse: 63 61 67 59  Temp: 97.4 F (36.3 C) 97.8 F (36.6 C)  98.4 F (36.9 C)  TempSrc: Oral Oral  Oral  Resp: 13 16  16   Weight: 118 lb 13.3 oz (53.9 kg) 118 lb 2.7 oz (53.6 kg)    SpO2: 97% 95%  96%   Weight change:   Intake/Output Summary (Last 24 hours) at 09/27/13 1030 Last data filed at 09/27/13 0900  Gross per 24 hour  Intake    240 ml  Output   2500 ml  Net  -2260 ml   Physical Exam General: alert, cooperative with most of exam, converses in Burmese, sitting up in bed, eating breakfast HEENT: Grinnell/AT, PERRL, conjunctiva normal, sclera anicteric Neck: Supple, Miklos JVD. Right IJ catheter in place, covered by clean bandage CV: RRR, normal S1/S2, Kuba m/g/r Pulm: CTA bilaterally, Claxton wheezes, Aguiniga cough Abd: Patient did not want to lay down for abdominal exam. BS+, mild tenderness to RUQ. Msk: normal ROM, Adsit edema Neuro: alert, oriented to person, place, and time. Fouse CN deficits.  Skin: Tattoos on left forearm and both arms. PPD placed on left forearm.   Lab Results: Basic Metabolic Panel:  Recent Labs  09/26/13 0504 09/26/13 1530 09/27/13 0436  NA 140 142 142  K 5.6* 5.0 4.6  CL 101 101 100  CO2 21 20 26   GLUCOSE 81 69* 69*  BUN 57* 62* 18  CREATININE 11.10* 12.04* 5.57*  CALCIUM 10.3 9.9 9.3  PHOS  --  5.7*  --    Liver Function Tests:  Recent Labs  09/26/13 0504 09/26/13 1530  AST 8  --   ALT 9  --   ALKPHOS 53  --   BILITOT 0.4  --   PROT 6.6  --   ALBUMIN 3.5 3.2*   Mallon results found for this basename: LIPASE, AMYLASE,  in the last 72 hours Ballew results found for this basename: AMMONIA,  in the last 72 hours CBC:  Recent  Labs  09/26/13 1530 09/27/13 0436  WBC 6.0 5.6  HGB 8.3* 8.9*  HCT 25.3* 26.8*  MCV 86.6 86.5  PLT 94* 101*   Cardiac Enzymes: Ridings results found for this basename: CKTOTAL, CKMB, CKMBINDEX, TROPONINI,  in the last 72 hours BNP: Chretien results found for this basename: PROBNP,  in the last 72 hours D-Dimer: Hyneman results found for this basename: DDIMER,  in the last 72 hours CBG: Landry results found for this basename: GLUCAP,  in the last 72 hours Hemoglobin A1C: Behring results found for this basename: HGBA1C,  in the last 72 hours Fasting Lipid Panel: Bicking results found for this basename: CHOL, HDL, LDLCALC, TRIG, CHOLHDL, LDLDIRECT,  in the last 72 hours Thyroid Function Tests: Soucek results found for this basename: TSH, T4TOTAL, FREET4, T3FREE, THYROIDAB,  in the last 72 hours Anemia Panel:  Recent Labs  09/26/13 1058  FERRITIN 938*  TIBC 190*  IRON 103   Coagulation: Steen results found for this basename: LABPROT, INR,  in the last 72 hours Urine Drug Screen: Drugs of Abuse  Walthall results found for this basename: labopia, cocainscrnur, labbenz, amphetmu, thcu, labbarb    Alcohol Level: Drum results found  for this basename: ETH,  in the last 72 hours Urinalysis: Beal results found for this basename: COLORURINE, APPERANCEUR, LABSPEC, Penn, GLUCOSEU, HGBUR, BILIRUBINUR, KETONESUR, PROTEINUR, UROBILINOGEN, NITRITE, LEUKOCYTESUR,  in the last 72 hours   Micro Results: Ates results found for this or any previous visit (from the past 240 hour(s)). Studies/Results: Dg Chest 2 View  09/26/2013   CLINICAL DATA:  Eval for HD cath  EXAM: CHEST  2 VIEW  COMPARISON:  None.  FINDINGS: Low lung volumes. Cardiac silhouette is enlarged. A right internal jugular sheath is appreciated tip superior vena cava. [name] pneumothorax. There is diffuse prominence of interstitial markings and central peribronchial cuffing. Fletchall focal region of consolidation nor focal infiltrates. Canterbury acute osseous abnormalities.  IMPRESSION:  Interstitial findings consistent with pulmonary edema.  Mccowen focal regions of consolidation.  Frenette pneumothorax.  Cardiomegaly.   Electronically Signed   By: [name] M.D.   On: 09/26/2013 11:31   US Abdomen Complete  09/26/2013   CLINICAL DATA:  Eval RUQ, history of end-stage renal disease receiving dialysis  EXAM: ULTRASOUND ABDOMEN COMPLETE  COMPARISON:  None.  FINDINGS: Gallbladder:  Cogdell gallstones. Gallbladder wall is slightly prominent measuring 3.3 mm in thickness. Choy pericholecystic fluid nor a sonographic Murphy sign.  Common bile duct:  Diameter: 4.4 mm  Liver:  Andel focal lesion identified. Within normal limits in parenchymal echogenicity.  IVC:  Barrero abnormality visualized.  Pancreas:  Visualized portion unremarkable.  Spleen:  Size and appearance within normal limits.  Right Kidney:  Length: 7.6 cm. Decreased corticomedullary differentiation and diffuse increased cortical echogenicity. Cramer hydronephrosis. Finau gross evidence of solid masses.  Left Kidney:  Length: 6.26 cm. Decreased corticomedullary differentiation, diffuse increased cortical echogenicity and Sorber evidence of hydronephrosis. Copenhaver gross evidence of solid masses.  Abdominal aorta:  Kassin aneurysm visualized.  Other findings:  None.  IMPRESSION: Gallbladder wall prominence without further sonographic evidence of cholecystitis. Correlation with biliary function tests recommended if clinically warranted.  Bilateral renal atrophy with sonographic findings consistent with patient's history of end-stage renal disease.   Electronically Signed   By: [name] M.D.   On: 09/26/2013 12:13   Medications: I have reviewed the patient's current medications. Scheduled Meds: . darbepoetin (ARANESP) injection - DIALYSIS  60 mcg Intravenous Q Fri-HD  . hepatitis A virus (PF) vaccine  1 mL Intramuscular Once  . hepatitis b vaccine for adults  1 mL Intramuscular Once  . lanthanum  500 mg Oral TID WC  . lisinopril  10 mg Oral Daily  . sodium chloride  3  mL Intravenous Q12H  . tuberculin  5 Units Intradermal Once   Continuous Infusions:  PRN Meds:.acetaminophen, acetaminophen, hydrALAZINE Assessment/Plan: Mr. Vanderpol is a 48yo man, refugee from [place], [place] speaker, with PMH of ESRD on HD 3 times weekly and HTN presenting to establish care in [place].  1. ESRD on HD - The patient is a refugee from [place], with last HD July 9th prior to his arrival to the [place]. Per medical records from [place], his first HD was in December 2014 at [facility] likely due to uncontrolled hypertension. He has been getting HD 3 times per week for 4 hour session with 1.95m2 polysulfone dialyzer, blood flow 311ml/min, heparin 2000 u prime 1000uper hour, HCO2 dialysate and dry weight of 55 Kg. His vascular access has been Right IJ which was placed in December 2014. He was supposed to get AVF but was unable to get surgery due to "small veins". Last HD screening  labs in 6/30 drawn at [facility] with HIV 1/2 non reactive, hep B antigen nonreactive, hep B antibody >1000.0 mIU/ml, anti-HCV non reactive. CXR from March 5th 2015 with "right IJ cath in-situ with borderline cardiomegaly". CXR here shows cardiomegaly, with Johns focal region of consolidation nor focal infiltrates, interstitial findings consistent with pulmonary edema.  - Continue telemetry  - Nephrology consulted, appreciate help with inpatient HD and setting up outpatient HD  - Renal diet--pt prefers rice and vegetables and Raffel beef or pork products  - Vascular Surgery consulted for AVF placement--> awaiting vein mapping  - Repeat HIV neg - Repeat Hep panel shows HepC neg, Gellatly immunity to HepA and B, and Casino current active HepB infection - Hep A and B vaccines ordered - PPD placed yesterday, follow up results tomorrow - f/u PTH level   2. HTN - BP as high as 200/116 in the ED but trended down to 177/96. His usual antihypertensive regimen include amlodipine 10mg  daily and enalapril 10mg   daily but it is unclear when he last took his medications. Volume overload with Pulmonary edema per CXR likely also worsening his HTN. Today, BP 164/100.  - Continue amlodipine 10mg  PO daily  - Start Lisinopril 10mg  PO daily - Hydralazine 5mg  PRN for SPB >180   3. Abdominal tenderness, RUQ - Mild. Abdominal US with negative Murphy's sign, normal liver, pancreas (partially visualized), and common bile duct but with gallbladder wall prominence without further sonographic evidence of cholecystitis. He denies N/V/D and is tolerating food well. He is afebrile with Wirsing leucocytosis. Cholecystitis unlikely at this time.  - Continue monitoring   4. Anemia of chronic disease - Last Hg on 7/6 at 6.6, per [place] medical report it appears that he had been receiving Erythropoietin 6000 U once per week since Jun 22 and received 2 units of pRBC during his last HD session in July 9th. His CBC here reveal Hg of 8.8. He is on ferrous fumarate oral supplementation as well. CBC today shows Hbg 8.9. Iron panel shows Fe 103, TIBC 190, Ferritin 938 which is consistent with anemia of chronic disease as expected. - Continue to monitor - Will follow Nephrology recs on iron supplementation   5.Thrombocytopenia - Chronic issue for him. Platelets of 91 on presentation, reported as low as 50s with clumping. Jury bleeding noted.  - Continue to monitor  6. Bradycardia - HR of 40s in the ED but increased to 50s. Patient asymptomatic. Not on BB. EKG shows normal sinus rhythm, prolonged QT, peaked T waves in II, III and avF. Peaked T waves likely from elevated K on admission (5.6). - Continue Telemetry  - Avoid QT prolonging medications  7. Hearing loss - Stable. He has reported severe right ear hearing loss and mild left ear hearing loss for over 10 years. He does not have hearing aides.  - May need outpatient ENT follow up though financial situation may be a barrier   8. Refugee status - Claxton signs of active TB. PPD placed,  results tomorrow. IOM contact is Leanora Ivanoff [phone]. Dr. Hayes Ludwig spoke with her and she states that the patient already has Medicaid application in process as well as housing, food, and other community help. She will be in contact with the [facility] social worker and case managers. Prior to his departure from [place], the patient has been deemed independent of his ADLs and will likely not need ALF or SNF placement but we will await formal PT/OT evaluation prior to his  housing arrangement is finalized.  - Social work/Care management consulted for placement/housing assistance  - PT/OT consulted  Diet: Renal diet, Huertas beef or pork products DVT prophylaxis: SCDs  Dispo: Disposition is deferred at this time, awaiting improvement of current medical problems. Anticipated discharge in approximately 7 day(s).    The patient does not have a current PCP ([facility] Per Patient) and does need an [facility] hospital follow-up appointment after discharge.  The patient does not have transportation limitations that hinder transportation to clinic appointments.  .Services Needed at time of discharge: Y = Yes, Blank = [place] PT:   OT:   RN:   Equipment:   Other:     LOS: 1 day   [name], MD 09/27/2013, 10:30 AM

## 2013-09-27 NOTE — H&P (Signed)
Internal Medicine Attending Admission Note Date: 09/27/2013  Patient name: Keith Vaughan Medical record number: [id number] Date of birth: Dec 03, 1965 Age: 48 y.o. Gender: male  I saw and evaluated the patient. I reviewed the resident's note and I agree with the resident's findings and plan as documented in the resident's note.  Chief Complaint(s): Need to establish hemodialysis locally.  History - key components related to admission:  Keith Vaughan is a 48 year old man with a history of hypertension and presumably subsequent end-stage renal disease requiring hemodialysis who is a [place] refugee that arrived in [place] from [place] yesterday. At the airport he was transported to the hospital to establish hemodialysis in the [place]. He was without specific complaints other than some neck stiffness around the site of his temporary dialysis catheter in the right internal jugular vein and some mild tenderness to palpation in the right upper quadrant that was not present unless palpated. He was admitted to the internal medicine teaching service for basic evaluation prior to hemodialysis in the [place], establishment of a more permanent dialysis access, and establishment of an outpatient hemodialysis center to attend.  Physical Exam - key components related to admission:  Filed Vitals:   09/26/13 1850 09/26/13 2058 09/26/13 2254 09/27/13 0455  BP: 181/107 183/103 169/86 177/99  Pulse: 63 61 67 59  Temp: 97.4 F (36.3 C) 97.8 F (36.6 C)  98.4 F (36.9 C)  TempSrc: Oral Oral  Oral  Resp: 13 16  16   Weight: 118 lb 13.3 oz (53.9 kg) 118 lb 2.7 oz (53.6 kg)    SpO2: 97% 95%  96%   General: Well-developed, well-nourished, man sitting comfortably in bed in [place] acute distress. There is a large bandage over his right internal jugular insertion site. Lungs: Clear to auscultation bilaterally without wheezes, rhonchi, or rales. Heart: Regular rate and rhythm without murmurs, rubs, or gallops. Abdomen:  Tenderness to palpation in the right upper quadrant without rebound.  Lab results:  Basic Metabolic Panel:  Recent Labs  09/26/13 0504 09/26/13 1530 09/27/13 0436  NA 140 142 142  K 5.6* 5.0 4.6  CL 101 101 100  CO2 21 20 26   GLUCOSE 81 69* 69*  BUN 57* 62* 18  CREATININE 11.10* 12.04* 5.57*  CALCIUM 10.3 9.9 9.3  PHOS  --  5.7*  --    Liver Function Tests:  Recent Labs  09/26/13 0504 09/26/13 1530  AST 8  --   ALT 9  --   ALKPHOS 53  --   BILITOT 0.4  --   PROT 6.6  --   ALBUMIN 3.5 3.2*   CBC:  Recent Labs  09/26/13 1530 09/27/13 0436  WBC 6.0 5.6  HGB 8.3* 8.9*  HCT 25.3* 26.8*  MCV 86.6 86.5  PLT 94* 101*   Anemia Panel:  Recent Labs  09/26/13 1058  FERRITIN 938*  TIBC 190*  IRON 103   Misc. Labs:  Hepatitis B surface antigen negative Hepatitis be core IgM antibody negative Hepatitis A. IgM negative Hepatitis C antibody negative  HIV nonreactive  Parathyroid hormone pending  PPD pending  Imaging results:  Dg Chest 2 View  09/26/2013   CLINICAL DATA:  Eval for HD cath  EXAM: CHEST  2 VIEW  COMPARISON:  None.  FINDINGS: Low lung volumes. Cardiac silhouette is enlarged. A right internal jugular sheath is appreciated tip superior vena cava. Mccullers pneumothorax. There is diffuse prominence of interstitial markings and central peribronchial cuffing. Addair focal region of consolidation nor focal infiltrates.  Brennan acute osseous abnormalities.  IMPRESSION: Interstitial findings consistent with pulmonary edema.  Budde focal regions of consolidation.  Gal pneumothorax.  Cardiomegaly.   Electronically Signed   By: [name] M.D.   On: 09/26/2013 11:31   US Abdomen Complete  09/26/2013   CLINICAL DATA:  Eval RUQ, history of end-stage renal disease receiving dialysis  EXAM: ULTRASOUND ABDOMEN COMPLETE  COMPARISON:  None.  FINDINGS: Gallbladder:  Griffing gallstones. Gallbladder wall is slightly prominent measuring 3.3 mm in thickness. Hennes pericholecystic fluid nor a  sonographic Murphy sign.  Common bile duct:  Diameter: 4.4 mm  Liver:  Salamon focal lesion identified. Within normal limits in parenchymal echogenicity.  IVC:  Kushner abnormality visualized.  Pancreas:  Visualized portion unremarkable.  Spleen:  Size and appearance within normal limits.  Right Kidney:  Length: 7.6 cm. Decreased corticomedullary differentiation and diffuse increased cortical echogenicity. Gallion hydronephrosis. Budney gross evidence of solid masses.  Left Kidney:  Length: 6.26 cm. Decreased corticomedullary differentiation, diffuse increased cortical echogenicity and Frimpong evidence of hydronephrosis. Aro gross evidence of solid masses.  Abdominal aorta:  Chalker aneurysm visualized.  Other findings:  None.  IMPRESSION: Gallbladder wall prominence without further sonographic evidence of cholecystitis. Correlation with biliary function tests recommended if clinically warranted.  Bilateral renal atrophy with sonographic findings consistent with patient's history of end-stage renal disease.   Electronically Signed   By: [name] M.D.   On: 09/26/2013 12:13   Other results:  EKG: Normal sinus rhythm at 64 beats per minute, normal axis, normal intervals with the exception of a slightly prolonged QTC, Resnik significant Q waves, Pettie LVH, good R wave progression, slight peaking of the T waves in the inferior leads, T wave inversion in aVL, Lehner comparisons available.  Assessment & Plan by Problem:  Keith Vaughan is a 48 year old man from [place] with end-stage renal disease requiring hemodialysis presumably secondary to hypertension who presents for establishment of hemodialysis in the [place] upon arrival from [place]. Permanent access is required and vascular surgery has been consulted to assist in establishing such. His initial evaluation for hemodialysis has begun with serology being negative and the parathyroid hormone level pending. He is currently stable and would be ready for discharge once this process has begun.  1)  End-stage renal disease requiring hemodialysis: Nephrology has been consulted and already begun hemodialysis. Vascular surgery has been consulted and is in the process of evaluating him for permanent dialysis access. Blood work is negative to date with the parathyroid hormone pending at this time. We will ask social work to begin the process to establish admission to an outpatient hemodialysis center.  2) Hypertension: He is currently on amlodipine 10 mg by mouth daily. We will start lisinopril 10 mg by mouth daily and titrate up as necessary and tolerated.  3) Disposition: Once the hemodialysis access process has begun and an outpatient dialysis center has been established he should be ready for discharge home. If establishment of a hemodialysis center is going to take considerable time we will discuss with nephrology as needed dialysis here as an outpatient.

## 2013-09-27 NOTE — Progress Notes (Signed)
Patient ID: Keith Vaughan, male   DOB: 1965-04-02, 48 y.o.   MRN: [id number] Patient needs vein mapping Will determine timing of access creation for next week

## 2013-09-27 NOTE — Progress Notes (Signed)
I saw and evaluated the patient. I developed the assessment and plan with the housestaff and reviewed the resident's note by Dr. Arcelia Jew.  I agree with the resident's findings and plans as documented in her progress note.

## 2013-09-27 NOTE — Progress Notes (Signed)
PT Cancellation Note  Patient Details Name: Voltaire Vavra MRN: [id number] DOB: [birth year]   Cancelled Treatment:    Reason Eval/Treat Not Completed: Patient at procedure or test/unavailable.  Attempted to see patient - in HD.  Will return tomorrow for PT evaluation.   Despina Pole 09/27/2013, 8:07 PM Carita Pian. Sanjuana Kava, [place] Pager [phone]

## 2013-09-27 NOTE — Progress Notes (Signed)
Patient ID: Keith Vaughan, male   DOB: March 27, 1965, 48 y.o.   MRN: [id number]  [facility] Progress Note   Assessment/ Plan:   1. End-stage renal disease: Hemodialysis ordered again for today. Seen by vascular surgery and awaiting vein mapping for permanent access planning. He also needs conversion of his temporary right IJ dialysis catheter to a tunneled (PermCath). He needs verification of insurance/legal status before he can be placed at an outpatient dialysis unit. Hepatitis testing negative, HIV negative. 2. Hyperkalemia: Corrected with hemodialysis, continue renal diet.  3. Hypertension: Started yesterday on amlodipine-blood pressure still elevated. Ultrafiltration at dialysis as tolerated.  4. Anemia of chronic kidney disease: Iron stores appear adequate, started on ESA  5. 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 Metabolic bone disease: Elevated phosphorous level, started phosphate binder-lanthanum 500 mg 3 times a day a.c.  Subjective:   Vo acute events overnight-motions that he is doing well    Objective:   BP 177/99  Pulse 59  Temp(Src) 98.4 F (36.9 C) (Oral)  Resp 16  Wt 53.6 kg (118 lb 2.7 oz)  SpO2 96%  Physical Exam: Gen: Comfortably sitting up in recliner CVS: Pulse regular bradycardia, S1 and S2 with ESM Resp: Clear to auscultation bilaterally Abd: Soft, flat, nontender Ext: Clymer lower extremity edema  Labs: BMET  Recent Labs Lab 09/26/13 0504 09/26/13 1530 09/27/13 0436  NA 140 142 142  K 5.6* 5.0 4.6  CL 101 101 100  CO2 21 20 26   GLUCOSE 81 69* 69*  BUN 57* 62* 18  CREATININE 11.10* 12.04* 5.57*  CALCIUM 10.3 9.9 9.3  PHOS  --  5.7*  --    CBC  Recent Labs Lab 09/26/13 0504 09/26/13 1530 09/27/13 0436  WBC 6.2 6.0 5.6  HGB 8.8* 8.3* 8.9*  HCT 26.6* 25.3* 26.8*  MCV 87.8 86.6 86.5  PLT 91* 94* 101*   Medications:    . darbepoetin (ARANESP) injection - DIALYSIS  60 mcg Intravenous Q Fri-HD  . hepatitis A virus  (PF) vaccine  1 mL Intramuscular Once  . hepatitis b vaccine for adults  1 mL Intramuscular Once  . lisinopril  10 mg Oral Daily  . sodium chloride  3 mL Intravenous Q12H  . tuberculin  5 Units Intradermal Once   Elmarie Shiley, MD 09/27/2013, 10:06 AM

## 2013-09-27 NOTE — Progress Notes (Signed)
VASCULAR LAB PRELIMINARY  PRELIMINARY  PRELIMINARY  PRELIMINARY  Right  Upper Extremity Vein Map    Cephalic  Segment Diameter Comment  1.Proximal right upper arm 2.8 mm   2. Mid upper arm 1.7 mm   3. Above AC 3.2 mm Branch  4. In AC 2.9 mm   5. Below AC 3.1 mm   6. Mid forearm 3.0 mm   7. Wrist 2.3 mm    Basilic  Segment Diameter Depth Comment  2. Mid upper arm 4.5 mm 15.7 mm Enters brachail  3. Above AC 2.5 mm 13.9 mm   4. In AC mm mm Not visualized  5. Below AC 1.5 mm 1.9 mm   6. Mid forearm 1.4 mm 2.0 mm   7. Wrist 1.6 mm 1.7 mm      Left Upper Extremity Vein Map     Cephalic  Segment Diameter Comment  1. Axilla 2.7 mm   2. Mid upper arm 2.7 mm   3. Above AC 2.7 mm   4. In AC 2.4 mm   5. Below AC 4.7 mm Branch  6. Mid forearm 3.6 mm   7. Wrist 3.0 mm    Basilic  Segment Diameter Depth Comment  1. Axilla mm mm   2. Above AC 2.7 mm 2.5 mm Enters brachial  4. In AC 4.4 mm 4.7 mm   5. Below AC 4.0 mm 1.8 mm   6. Mid forearm 3.0 mm 1.9 mm   7. Wrist 1.7 mm 1.14mm    All areas of the veins bilateally were patent and compressible  [facility], RVS 09/27/2013, [phone]

## 2013-09-28 NOTE — Progress Notes (Signed)
Subjective: Interview was done with Bur[place]ese phone interpreter. Keith Vaughan. Pt doing well this AM and has Sagan complaints.   Objective: Vital signs in last 24 hours: Filed Vitals:   09/27/13 2136 09/27/13 2154 09/27/13 2200 09/28/13 0454  BP: 152/100 143/85 124/61 131/91  Pulse: 64 73 70 65  Temp: 98 F (36.7 C) 98.2 F (36.8 C)  97.5 F (36.4 C)  TempSrc: Oral Oral  Oral  Resp: 16 17 12 16   Weight:  120 lb 13 oz (54.8 kg)    SpO2: 98% 98%  97%   Weight change: -3 lb 8.4 oz (-1.6 kg)  Intake/Output Summary (Last 24 hours) at 09/28/13 0717 Last data filed at 09/27/13 2200  Gross per 24 hour  Intake    840 ml  Output   2001 ml  Net  -1161 ml   Physical Exam General: alert, cooperative with most of exam, converses in Burmese, sitting up in bed, eating breakfast Neck: Supple, Mcclenathan JVD. Right IJ catheter in place, covered by clean bandage CV: RRR, normal S1/S2, Lukic m/g/r Pulm: CTA bilaterally, Gorman wheezes, Priego cough Msk: normal ROM, Ireland edema Neuro: alert, oriented to person, place, and time. Steinhoff CN deficits.  Skin: Tattoos on left forearm and both arms. PPD placed on left forearm.   Lab Results: Basic Metabolic Panel:  Recent Labs  09/26/13 0504 09/26/13 1530 09/27/13 0436  NA 140 142 142  K 5.6* 5.0 4.6  CL 101 101 100  CO2 21 20 26   GLUCOSE 81 69* 69*  BUN 57* 62* 18  CREATININE 11.10* 12.04* 5.57*  CALCIUM 10.3 9.9 9.3  PHOS  --  5.7*  --    Liver Function Tests:  Recent Labs  09/26/13 0504 09/26/13 1530  AST 8  --   ALT 9  --   ALKPHOS 53  --   BILITOT 0.4  --   PROT 6.6  --   ALBUMIN 3.5 3.2*   CBC:  Recent Labs  09/26/13 1530 09/27/13 0436  WBC 6.0 5.6  HGB 8.3* 8.9*  HCT 25.3* 26.8*  MCV 86.6 86.5  PLT 94* 101*   Anemia Panel:  Recent Labs  09/26/13 1058  FERRITIN 938*  TIBC 190*  IRON 103   Micro Results: Biven results found for this or any previous visit (from the past 240 hour(s)). Studies/Results: Dg Chest 2 View  09/26/2013    CLINICAL DATA:  Eval for HD cath  EXAM: CHEST  2 VIEW  COMPARISON:  None.  FINDINGS: Low lung volumes. Cardiac silhouette is enlarged. A right internal jugular sheath is appreciated tip superior vena cava. Gierke pneumothorax. There is diffuse prominence of interstitial markings and central peribronchial cuffing. Potash focal region of consolidation nor focal infiltrates. Hirst acute osseous abnormalities.  IMPRESSION: Interstitial findings consistent with pulmonary edema.  Fesler focal regions of consolidation.  Heritage pneumothorax.  Cardiomegaly.   Electronically Signed   By: [name] M.D.   On: 09/26/2013 11:31   US Abdomen Complete  09/26/2013   CLINICAL DATA:  Eval RUQ, history of end-stage renal disease receiving dialysis  EXAM: ULTRASOUND ABDOMEN COMPLETE  COMPARISON:  None.  FINDINGS: Gallbladder:  Bonsignore gallstones. Gallbladder wall is slightly prominent measuring 3.3 mm in thickness. Kreiser pericholecystic fluid nor a sonographic Murphy sign.  Common bile duct:  Diameter: 4.4 mm  Liver:  Salah focal lesion identified. Within normal limits in parenchymal echogenicity.  IVC:  Hengel abnormality visualized.  Pancreas:  Visualized portion unremarkable.  Spleen:  Size and  appearance within normal limits.  Right Kidney:  Length: 7.6 cm. Decreased corticomedullary differentiation and diffuse increased cortical echogenicity. Frayne hydronephrosis. Eisman gross evidence of solid masses.  Left Kidney:  Length: 6.26 cm. Decreased corticomedullary differentiation, diffuse increased cortical echogenicity and Dayton evidence of hydronephrosis. Isaacks gross evidence of solid masses.  Abdominal aorta:  Quilling aneurysm visualized.  Other findings:  None.  IMPRESSION: Gallbladder wall prominence without further sonographic evidence of cholecystitis. Correlation with biliary function tests recommended if clinically warranted.  Bilateral renal atrophy with sonographic findings consistent with patient's history of end-stage renal disease.   Electronically Signed   By:  [name] M.D.   On: 09/26/2013 12:13   Medications: I have reviewed the patient's current medications. Scheduled Meds: . amLODipine  10 mg Oral Daily  . darbepoetin (ARANESP) injection - DIALYSIS  60 mcg Intravenous Q Fri-HD  . lanthanum  500 mg Oral TID WC  . lisinopril  10 mg Oral Daily  . sodium chloride  3 mL Intravenous Q12H  . tuberculin  5 Units Intradermal Once   Continuous Infusions:  PRN Meds:.acetaminophen, acetaminophen, hydrALAZINE Assessment/Plan: Keith Vaughan is a 48yo man, refugee from [place], [place] speaker, with PMH of ESRD on HD 3 times weekly and HTN presenting to establish care in [place].  # ESRD on HD - The patient is a refugee from [place], with last HD July 9th prior to his arrival to the [place]. Per medical records from [place], his first HD was in December 2014 at [facility] likely due to uncontrolled hypertension. His vascular access has been Right IJ which was placed in December 2014.Last HD screening labs in 6/30 drawn at [facility] with HIV 1/2 non reactive, hep B antigen nonreactive, hep B antibody >1000.0 mIU/ml, anti-HCV non reactive (all these repeat labs have been negative). CXR from March 5th 2015 with "right IJ cath in-situ with borderline cardiomegaly". CXR here shows cardiomegaly, with Bruhl focal region of consolidation nor focal infiltrates, interstitial findings consistent with pulmonary edema.  - Continue telemetry  - Nephrology consulted, appreciate help with inpatient HD and setting up outpatient HD  - Renal diet--pt prefers rice and vegetables and Rhee beef or pork products  - Vascular Surgery consulted for AVF placement (vein mapping completed 09/27/13) - Hep A and B vaccines ordered - PPD placed yesterday, follow up results on 7/12   # HTN - BP as high as 200/116 on admission before HD but is now normal. His usual antihypertensive regimen include amlodipine 10mg  daily and enalapril 10mg  daily but it is unclear when  he last took his medications.  - Continue amlodipine 10mg  PO daily and Lisinopril 10mg  PO daily - Hydralazine 5mg  PRN for SPB >180   # Anemia of chronic disease - Last Hg on 7/6 at 6.6>>8.9 , per [place] medical report it appears that he had been receiving Erythropoietin 6000 U once per week since Jun 22 and received 2 units of pRBC during his last HD session on July 9th.  Iron panel shows Fe 103, TIBC 190, Ferritin 938 which is consistent with anemia of chronic disease as expected. - Continue to monitor  # Thrombocytopenia - Chronic issue for him. Platelets of 91>>101. Aristizabal bleeding noted.  - Continue to monitor  # Refugee status - Kats signs of active TB. PPD placed, results7/12. IOM contact is Leanora Ivanoff [phone]. Dr. Hayes Ludwig spoke with her and she states that the patient already has Medicaid application in process as well as housing, food,  and other community help. She will be in contact with the [facility] social worker and case managers. Prior to his departure from [place], the patient has been deemed independent of his ADLs and will likely not need ALF or SNF placement but we will await formal PT/OT evaluation prior to his housing arrangement is finalized.  - Social work/Care management consulted for placement/housing assistance  - PT/OT consulted  Diet: Renal diet, Thelin beef or pork products DVT prophylaxis: SCDs  Dispo: Disposition is deferred at this time, awaiting improvement of current medical problems. Anticipated discharge in approximately 7 day(s).    The patient does not have a current PCP ([facility] Pcp Per Patient) and does need an [facility] hospital follow-up appointment after discharge.  The patient does not have transportation limitations that hinder transportation to clinic appointments.  .Services Needed at time of discharge: Y = Yes, Blank = Yakubov PT:   OT:   RN:   Equipment:   Other:     LOS: 2 days   Clinton Gallant, MD 09/28/2013, 7:17 AM

## 2013-09-28 NOTE — Plan of Care (Signed)
Problem: Phase I Progression Outcomes Goal: Initial discharge plan identified Outcome: Progressing Working on [employer] and immigrant status. Plan for AVF to be placed on Wednesday 7/16 per Dr. Kellie Simmering pending ability to get informed consent.

## 2013-09-28 NOTE — Progress Notes (Signed)
Patient ID: Keith Vaughan, male   DOB: 04-26-1965, 48 y.o.   MRN: [id number] Vascular Surgery Progress Note  Subjective: Patient with end-stage renal disease needs vascular access. [place] interpreter available.  Objective:  Filed Vitals:   09/28/13 0454  BP: 131/91  Pulse: 65  Temp: 97.5 F (36.4 C)  Resp: 16    General alert and oriented x3-cannot communicate because of language barrier Left upper extremity appears to have adequate cephalic vein in the forearm for AV fistula   Labs:  Recent Labs Lab 09/26/13 0504 09/26/13 1530 09/27/13 0436  CREATININE 11.10* 12.04* 5.57*    Recent Labs Lab 09/26/13 0504 09/26/13 1530 09/27/13 0436  NA 140 142 142  K 5.6* 5.0 4.6  CL 101 101 100  CO2 21 20 26   BUN 57* 62* 18  CREATININE 11.10* 12.04* 5.57*  GLUCOSE 81 69* 69*  CALCIUM 10.3 9.9 9.3    Recent Labs Lab 09/26/13 0504 09/26/13 1530 09/27/13 0436  WBC 6.2 6.0 5.6  HGB 8.8* 8.3* 8.9*  HCT 26.6* 25.3* 26.8*  PLT 91* 94* 101*   Gills results found for this basename: INR,  in the last 168 hours  I/O last 3 completed shifts: In: 840 [P.O.:840] Out: 2001 [Urine:1; Other:2000]  Imaging: Dg Chest 2 View  09/26/2013   CLINICAL DATA:  Eval for HD cath  EXAM: CHEST  2 VIEW  COMPARISON:  None.  FINDINGS: Low lung volumes. Cardiac silhouette is enlarged. A right internal jugular sheath is appreciated tip superior vena cava. Milner pneumothorax. There is diffuse prominence of interstitial markings and central peribronchial cuffing. Mckinstry focal region of consolidation nor focal infiltrates. Arrellano acute osseous abnormalities.  IMPRESSION: Interstitial findings consistent with pulmonary edema.  Krahl focal regions of consolidation.  Rovito pneumothorax.  Cardiomegaly.   Electronically Signed   By: [name] M.D.   On: 09/26/2013 11:31   US Abdomen Complete  09/26/2013   CLINICAL DATA:  Eval RUQ, history of end-stage renal disease receiving dialysis  EXAM: ULTRASOUND ABDOMEN COMPLETE  COMPARISON:   None.  FINDINGS: Gallbladder:  Kamm gallstones. Gallbladder wall is slightly prominent measuring 3.3 mm in thickness. Kubicek pericholecystic fluid nor a sonographic Murphy sign.  Common bile duct:  Diameter: 4.4 mm  Liver:  Peters focal lesion identified. Within normal limits in parenchymal echogenicity.  IVC:  Lampley abnormality visualized.  Pancreas:  Visualized portion unremarkable.  Spleen:  Size and appearance within normal limits.  Right Kidney:  Length: 7.6 cm. Decreased corticomedullary differentiation and diffuse increased cortical echogenicity. Human hydronephrosis. Crandle gross evidence of solid masses.  Left Kidney:  Length: 6.26 cm. Decreased corticomedullary differentiation, diffuse increased cortical echogenicity and Meland evidence of hydronephrosis. Mahl gross evidence of solid masses.  Abdominal aorta:  Lizak aneurysm visualized.  Other findings:  None.  IMPRESSION: Gallbladder wall prominence without further sonographic evidence of cholecystitis. Correlation with biliary function tests recommended if clinically warranted.  Bilateral renal atrophy with sonographic findings consistent with patient's history of end-stage renal disease.   Electronically Signed   By: [name] M.D.   On: 09/26/2013 12:13    Assessment/Plan:    LOS: 2 days  s/p   Plan left radial-cephalic AV fistula and exchange of hemodialysis catheter right neck possibly on Wednesday Will need interpreter in order to obtain consent   [name], MD 09/28/2013 8:50 AM

## 2013-09-28 NOTE — Progress Notes (Addendum)
Telephone call to [facility] [phone] to inquire about Burmese interpreter. Spoke with interpreter [phone]. Interpreter stated that [place] own language, however, most people in the village understand Burmese. Interpreter was able to provide pt with some education about end stage renal disease, and was also able to determine that the pt has had some previous diet education. Pt stated that he has been sick since December 2014 with increased itchiness. Pt denied having any dizziness and reported Metsker history of falling down during the last 6 months. Education was provided for Fosrenol for phosphorus control, and should be taken with meals. Pt reported LBM this morning per his usual routine. Educated pt about his risk for constipation d/t diet and fluid restrictions and medications, like phosphate binders. Informed pt that physician who saw pt this morning was Dr. Kellie Simmering who is a vascular surgeon. Discussed plan for AVF placement on Wednesday. Discussed surgery would involve connecting an artery and vein in his left arm that would be allowed to mature over about 3 months before being used for hemodialysis access to clean his blood d/t his failing kidneys. Pt asked about this being major surgery for access placement. Discussed pain involved after surgery that would get better as surgical site healed. Discussed placement of permanent hemodialysis catheter to be placed in his chest while his fistula was maturing, and that the catheter in his neck would be removed prior to discharge. Pt did report some pain on the right side of his head and neck. ??r/t temporary hemodialysis catheter. Advised of tylenol given for pain a few minutes ago. Interpreter taught patient to use the words pain, pain and point to location in order to inform nurses/doctors of symptoms. Pt was also advised that the cyst like area on left outer ankle had been documented for MD. Prior to ending call pt was  again asked about understanding of AVF placement. Pt told interpreter that he does understand, and has Thinnes questions at this time. Appreciation expressed for interpreter assistance. Manya Silvas, RN  Interpreter reported that pt may have some hearing loss versus language barrier not completely corrected by using Burmese interpreter.

## 2013-09-28 NOTE — Progress Notes (Signed)
Patient ID: Keith Vaughan, male   DOB: [birth year], 48 y.o.   MRN: [id number]  [facility] Progress Note   Assessment/ Plan:   1. End-stage renal disease: Plan for his next hemodialysis treatment on Tuesday before undergoing access surgery on Wednesday. He needs verification of insurance/legal status before he can be placed at an outpatient dialysis unit. Hepatitis testing negative, HIV negative. Through the help of his friends who are translating, I educated him regarding fluid restriction less than 1500 cc per day as well as potassium restriction and phosphorus restriction. Appreciate help from Dr. Kellie Simmering with access planning. 2. Hyperkalemia: Corrected with hemodialysis, continue renal diet.  3. Hypertension: Blood pressures improving with amlodipine and lisinopril (he shows me his medications from [place] where he was taking enalapril and prazosin in addition to amlodipine) 4. Anemia of chronic kidney disease: Iron stores appear adequate, started on ESA  5. 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 Metabolic bone disease: Elevated phosphorous level, started phosphate binder-lanthanum 500 mg 3 times a day a.c. (binder choice will be eventually determined by what kind of insurance/prescription coverage he has)-in [place], he was taking calcium carbonate 500 mg 3 times a day with meals.  Subjective:   Through the help of a translating friend at his bedside-The patient reports some discomfort in his right neck and he is concerned that his catheter has been in for 6-7 months. He denies any shortness of breath, nausea or vomiting. He has been tolerating dialysis well. Reports that he is anuric.    Objective:   BP 140/88  Pulse 72  Temp(Src) 98 F (36.7 C) (Oral)  Resp 18  Wt 54.8 kg (120 lb 13 oz)  SpO2 98%  Physical Exam: Gen: Comfortably sitting up in bed CVS: Pulse regular in rate and rhythm, S1 and S2 normal. Right IJ temporary dialysis catheter Resp: Good  auscultation bilaterally, Pedigo rales/rhonchi Abd: Soft, flat, nontender Ext: [place]la lower extremity edema  Labs: BMET  Recent Labs Lab 09/26/13 0504 09/26/13 1530 09/27/13 0436  NA 140 142 142  K 5.6* 5.0 4.6  CL 101 101 100  CO2 21 20 26   GLUCOSE 81 69* 69*  BUN 57* 62* 18  CREATININE 11.10* 12.04* 5.57*  CALCIUM 10.3 9.9 9.3  PHOS  --  5.7*  --    CBC  Recent Labs Lab 09/26/13 0504 09/26/13 1530 09/27/13 0436  WBC 6.2 6.0 5.6  HGB 8.8* 8.3* 8.9*  HCT 26.6* 25.3* 26.8*  MCV 87.8 86.6 86.5  PLT 91* 94* 101*    Medications:    . amLODipine  10 mg Oral Daily  . darbepoetin (ARANESP) injection - DIALYSIS  60 mcg Intravenous Q Fri-HD  . lanthanum  500 mg Oral TID WC  . lisinopril  10 mg Oral Daily  . sodium chloride  3 mL Intravenous Q12H  . tuberculin  5 Units Intradermal Once   Elmarie Shiley, MD 09/28/2013, 10:56 AM

## 2013-09-28 NOTE — Progress Notes (Signed)
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 Area is red, raised and 15x13 mm. Dr Marvel Plan informed. Pt informed via use of inceptor (11641) that pt may be moved to airborne room. RN Awaiting orders. Dr Ronnald Ramp called with orders.

## 2013-09-28 NOTE — Progress Notes (Signed)
Telephone call to [facility], [phone]. Spoke with Burmese interpreter [phone]. Pt had provided 2 bags of medication from [place] and hospital in [place]. Interpreter stated that pt stated that these were medications provided him in [place], and he wanted to know if he should continue taking. Interpreter was able to let the pt know that he should only take medications that are given to him in the hospital. Pt verbalized understanding to interpreter. Interpreter told the pt that meds in other countries are different. Pt was advised of norvasc and lisinopril administered. Pt was also educated about using urinal provided in order to accurately record I & O. Medications taken to pharmacy. Manya Silvas, RN

## 2013-09-28 NOTE — Evaluation (Signed)
Physical Therapy Evaluation Patient Details Name: Keith Vaughan MRN: [id number] DOB: 10-Jun-1965 Today's Date: 09/28/2013   History of Present Illness  Admitted with need for HD ([place] hospital straight [place], [place] immig[place] here); plan for HD access graft placement on wednesday  Clinical Impression   Patient evaluated by Physical Therapy with [facility] further acute PT needs identified. All education has been completed and the patient has [place] further questions.  See below for any follow-up Physical Therapy or equipment needs. PT is signing off. Thank you for this referral.     Follow Up Recommendations [place] PT follow up    Equipment Recommendations  None recommended by PT    Recommendations for Other Services       Precautions / Restrictions Precautions Precautions: None      Mobility  Bed Mobility Overal bed mobility: Independent                Transfers Overall transfer level: Independent Equipment used: None                Ambulation/Gait Ambulation/Gait assistance: Independent Ambulation Distance (Feet): 300 Feet (greater than) Assistive device: None Gait Pattern/deviations: WFL(Within Functional Limits) Gait velocity: normal   General Gait Details: Crocker dizziness, pain or activity intolerance when asked  Stairs            Wheelchair Mobility    Modified Rankin (Stroke Patients Only)       Balance Overall balance assessment: [place] apparent balance deficits (not formally assessed)                                           Pertinent Vitals/Pain [place] apparent distress     Home Living Family/patient expects to be discharged to:: Private residence       Home Access: Level entry     Home Layout: One level Home Equipment: None      Prior Function Level of Independence: Independent               Hand Dominance        Extremity/Trunk Assessment   Upper Extremity Assessment: Overall WFL for tasks assessed            Lower Extremity Assessment: Overall WFL for tasks assessed         Communication   Communication: Prefers language other than [place] (Burmese)  Cognition Arousal/Alertness: Awake/alert Behavior During Therapy: WFL for tasks assessed/performed Overall Cognitive Status: Within Functional Limits for tasks assessed                      General Comments      Exercises        Assessment/Plan    PT Assessment [place] does not need any further PT services  PT Diagnosis     PT Problem List    PT Treatment Interventions     PT Goals (Current goals can be found in the Care Plan section) Acute Rehab PT Goals Patient Stated Goal: Agreeable to amb PT Goal Formulation: [place] goals set, d/c therapy    Frequency     Barriers to discharge        Co-evaluation               End of Session   Activity Tolerance: Patient tolerated treatment well Patient left: in chair (independent in room) Nurse Communication: Mobility status  Time: 1220-1232 PT Time Calculation (min): 12 min   Charges:   PT Evaluation $Initial PT Evaluation Tier I: 1 Procedure     PT G Codes:          Roney Marion Hamff 09/28/2013, 1:05 PM  Roney Marion, [place]  Acute Rehabilitation Services Pager [phone] Office [phone]

## 2013-09-28 NOTE — Progress Notes (Signed)
While assessing patient, patient pointed out cyst-like area on outer aspect of L ankle that is about 1 inch in diameter and spongy. Phillippi pain noted. Will continue to monitor. Manya Silvas, RN

## 2013-09-29 LAB — PARATHYROID HORMONE, INTACT (NO CA): PTH: 65.1 pg/mL (ref 14.0–72.0)

## 2013-09-29 MED ORDER — CEFUROXIME SODIUM 1.5 G IJ SOLR
1.5000 g | INTRAMUSCULAR | Status: AC
Start: 2013-10-01 — End: 2013-10-01
  Administered 2013-10-01: 1.5 g via INTRAVENOUS
  Filled 2013-09-29: qty 1.5

## 2013-09-29 MED FILL — Hepatitis B Vaccine (Recombinant) Susp 10 MCG/ML: INTRAMUSCULAR | Qty: 1 | Status: AC

## 2013-09-29 NOTE — Progress Notes (Signed)
42. Assisted with call to [facility] in order for [place], [facility] to explain to pt about L AVF placement on Wednesday. Interpreter [phone] assisted with explanation. Pt was given the opportunity to ask questions, and verbalized understanding of the procedure.   1145. Telephone call to [place] to assist with pt signing consent for L AVF placement. Interpreter [phone] assisted with call. Pt agreed to have procedure done, and signed the consent. Pt was advised that he would not be able to have anything to eat or drink after midnight Tuesday in preparation for surgery on Wednesday.   When asked if pt had any other needs, he indicated he was ok. Will continue to monitor, and seek out interpreter services as needed. Manya Silvas, RN

## 2013-09-29 NOTE — Care Management Note (Signed)
CARE MANAGEMENT NOTE 09/29/2013  Patient:  REYMUNDO, VOGAN   Account Number:  [id number]  Date Initiated:  09/29/2013  Documentation initiated by:  Jasmine Pang  Subjective/Objective Assessment:   Noted consult for assistance with housing etc for this pt.     Action/Plan:   Anticipated DC Date:     Anticipated DC Plan:  HOME/SELF CARE         Choice offered to / List presented to:             Status of service:   Medicare Important Message given?   (If response is "[place]", the following Medicare IM given date fields will be blank) Date Medicare IM given:   Medicare IM given by:   Date Additional Medicare IM given:   Additional Medicare IM given by:    Discharge Disposition:    Per UR Regulation:    If discussed at Long Length of Stay Meetings, dates discussed:    Comments:  09/29/2013 Following for d/c needs, noted request for assistance with housing however also noted that pt has application for housing in place, as well as Medicaid application in process. Will attempt to clarify. CRoyal RN MPH, case manager, [phone]

## 2013-09-29 NOTE — Progress Notes (Signed)
Dr Ronnald Ramp stated pt had received a vaccine that could make the TB read positive and chest xray was negative. A quantiferon TB Gold assay was ordered and drawn. Results pending. Dr Ronnald Ramp felt that the pt did not need to be moved to an airborne isolation room at this time

## 2013-09-29 NOTE — Progress Notes (Signed)
Patient ID: Keith Vaughan, male   DOB: Mar 24, 1965, 48 y.o.   MRN: [id number] Attending physician note: Patient examined with resident physician Keith Vaughan and I concur with her assessment and management plan. 48 year old man who just arrived from [place]. Sensed atrial disease on dialysis. He remained inpatient pending appropriate outpatient arrangements for housing. 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 Not surprising given his [place] residence up until now. Keith Hopper, MD, [place]  Hematology-Oncology/Internal Medicine

## 2013-09-29 NOTE — Progress Notes (Signed)
Ellard Artis, RN called interpreter via telephone.  Discussed with pt about surgery for left radio-cephalic AVF and perm cath placement by Dr. Kellie Simmering on Wednesday.  Also discussed it takes 3 months to mature and would have to use catheter until then.  He also understands that fistulas don't always mature and may need further interventions in the future.  He does not have any questions at this time.  Will place preoperative orders.  Leontine Locket 09/29/2013 10:58 AM

## 2013-09-29 NOTE — Care Management Note (Signed)
ACARE MANAGEMENT NOTE 09/29/2013  Patient:  Keith Vaughan, Keith Vaughan   Account Number:  [id number]  Date Initiated:  09/29/2013  Documentation initiated by:  Jasmine Pang  Subjective/Objective Assessment:   Noted consult for assistance with housing etc for this pt.     Action/Plan:   CM unable to assist with housing, however given that pt has assistance of [facility] [facility], then he will need to use the resources provide by that agency. If medicaid application is in process the pt will most likely be able to get outpt HD   Anticipated DC Date:     Anticipated DC Plan:  HOME/SELF CARE         Choice offered to / List presented to:             Status of service:   Medicare Important Message given?   (If response is "[place]", the following Medicare IM given date fields will be blank) Date Medicare IM given:   Medicare IM given by:   Date Additional Medicare IM given:   Additional Medicare IM given by:    Discharge Disposition:    Per UR Regulation:    If discussed at Long Length of Stay Meetings, dates discussed:    Comments:  09/29/2013 Following for d/c needs, noted request for assistance with housing however also noted that pt has application for housing in place, as well as Medicaid application in process. Will attempt to clarify. CRoyal RN MPH, case manager, [phone] Spoke with CSW, and noted application of Medicaid is in process. With Medicaid the pt hopefully will be able to obtain outpatient hemodialysis schedule. Housing for this pt will be outside the scope of [facility] Case management. However pre the chart this pt has application for housing in process. CRoyal RN MPH, case manager, [phone]

## 2013-09-29 NOTE — Evaluation (Signed)
Occupational Therapy Evaluation Patient Details Name: Keith Vaughan MRN: [id number] DOB: 1965-12-25 Today's Date: 09/29/2013    History of Present Illness Admitted with need for HD ([place] hospital straight [place], [place] immig[place] here); plan for HD access graft placement on wednesday   Clinical Impression   Pt is independent with ADLs and ADL mobility, at baseline level of function. [place] further acute OT services are indicated at this time    Follow Up Recommendations  [place] OT follow up    Equipment Recommendations  None recommended by OT    Recommendations for Other Services       Precautions / Restrictions Precautions Precautions: None Restrictions Weight Bearing Restrictions: Hebel      Mobility Bed Mobility Overal bed mobility: Independent                Transfers Overall transfer level: Independent Equipment used: None                  Balance Overall balance assessment: [place] apparent balance deficits (not formally assessed)                                          ADL Overall ADL's : Independent;At baseline                                             Vision  [place] change from baseline                   Perception Perception Perception Tested?: Fennelly   Praxis Praxis Praxis tested?: Not tested    Pertinent Vitals/Pain Freyre c/o pain, VSS     Hand Dominance Right   Extremity/Trunk Assessment Upper Extremity Assessment Upper Extremity Assessment: Overall WFL for tasks assessed   Lower Extremity Assessment Lower Extremity Assessment: Defer to PT evaluation   Cervical / Trunk Assessment Cervical / Trunk Assessment: Normal   Communication Communication Communication: Prefers language other than English (speaks Burmese)   Cognition Arousal/Alertness: Awake/alert Behavior During Therapy: WFL for tasks assessed/performed Overall Cognitive Status: Within Functional Limits for tasks assessed                      General Comments   pt pleasant and coopertaive                 Home Living Family/patient expects to be discharged to:: Private residence       Home Access: Level entry     Home Layout: One level               Home Equipment: None          Prior Functioning/Environment Level of Independence: Independent             OT Diagnosis:                         Barriers to D/C:  none                        End of Session    Activity Tolerance: Patient tolerated treatment well Patient left: in bed   Time: [id number] OT Time Calculation (min): 19 min Charges:  OT General Charges $OT  Visit: 1 Procedure OT Evaluation $Initial OT Evaluation Tier I: 1 Procedure G-Codes:    Britt Bottom 09/29/2013, 1:48 PM

## 2013-09-29 NOTE — Progress Notes (Signed)
Patient ID: Keith Vaughan, male   DOB: [birth year], 48 y.o.   MRN: [id number]  [facility] Progress Note   Assessment/ Plan:   1. End-stage renal disease: Plan for his next hemodialysis treatment on Tuesday before undergoing access surgery on Wednesday. He needs verification of insurance/legal status before he can be placed at an outpatient dialysis unit. Hepatitis testing negative, HIV negative. 2. Hyperkalemia: Corrected with hemodialysis, continue renal diet.  3. Hypertension: Blood pressures improving with amlodipine and lisinopril (hs medications from [place] where he was taking enalapril and prazosin in addition to amlodipine) 4. Anemia of chronic kidney disease: Iron stores appear adequate, started on ESA  5. 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 Metabolic bone disease: Elevated phosphorous level, started phosphate binder-lanthanum 500 mg 3 times a day a.c. (binder choice will be eventually determined by what kind of insurance/prescription coverage he has)-in [place], he was taking calcium carbonate 500 mg 3 times a day with meals.  Subjective:   Updated pt w/ interpretor phone services. Pt w/o co.  Plan for AVF and [facility] Wednesday   Objective:   BP 170/102  Pulse 60  Temp(Src) 97.9 F (36.6 C) (Oral)  Resp 18  Wt 53.1 kg (117 lb 1 oz)  SpO2 100%  Physical Exam: Gen: Comfortably sitting up in bed CVS: Pulse regular in rate and rhythm, S1 and S2 normal. Right IJ temporary dialysis catheter Resp: Good auscultation bilaterally, [place]ppier rales/rhonchi Abd: Soft, flat, nontender Ext: [place] lower extremity edema  Labs: BMET  Recent Labs Lab 09/26/13 0504 09/26/13 1530 09/27/13 0436  NA 140 142 142  K 5.6* 5.0 4.6  CL 101 101 100  CO2 21 20 26   GLUCOSE 81 69* 69*  BUN 57* 62* 18  CREATININE 11.10* 12.04* 5.57*  CALCIUM 10.3 9.9 9.3  PHOS  --  5.7*  --    CBC  Recent Labs Lab 09/26/13 0504 09/26/13 1530 09/27/13 0436  WBC 6.2 6.0 5.6  HGB  8.8* 8.3* 8.9*  HCT 26.6* 25.3* 26.8*  MCV 87.8 86.6 86.5  PLT 91* 94* 101*    Medications:    . amLODipine  10 mg Oral Daily  . [START ON 10/01/2013] cefUROXime (ZINACEF)  IV  1.5 g Intravenous On Call to OR  . darbepoetin (ARANESP) injection - DIALYSIS  60 mcg Intravenous Q Fri-HD  . lanthanum  500 mg Oral TID WC  . lisinopril  10 mg Oral Daily  . sodium chloride  3 mL Intravenous Q12H   Pearson Grippe, MD 09/29/2013, 1:16 PM

## 2013-09-29 NOTE — Progress Notes (Signed)
Received call from central telemetry. Stated telemetry looked like possible second degree heart block type 2. Pt assessment unremarkable. Advised Dr. Beryle Beams of possible findings. Will continue to [place]. Orene Desanctis, RN

## 2013-09-29 NOTE — Progress Notes (Signed)
Subjective: All conversation done through Chief Financial Officer via Hotel manager services. Patient complains of some itchiness at site of PPD, but does not have any other complaints. He denies fever, chills, abdominal pain, N/V.  Objective: Vital signs in last 24 hours: Filed Vitals:   09/28/13 1708 09/28/13 2146 09/29/13 0535 09/29/13 0917  BP: 150/85 138/90 158/86 170/102  Pulse: 59 60 55 60  Temp: 98.4 F (36.9 C) 97.6 F (36.4 C) 97.8 F (36.6 C) 97.9 F (36.6 C)  TempSrc: Oral Oral Oral Oral  Resp: 18 18 18 18   Weight:  117 lb 1 oz (53.1 kg)    SpO2: 98% 99% 99% 100%   Weight change: -3 lb 12 oz (-1.7 kg)  Intake/Output Summary (Last 24 hours) at 09/29/13 1118 Last data filed at 09/29/13 1055  Gross per 24 hour  Intake   1200 ml  Output      5 ml  Net   1195 ml   Physical Exam General: alert, cooperative with most of exam, converses in Burmese, sitting up in bed, eating breakfast  Neck: Supple, Ogborn JVD. Right IJ catheter in place, covered by clean bandage  CV: RRR, normal S1/S2, Galindez m/g/r  Pulm: CTA bilaterally, Waldridge wheezes, Coppess cough  Ext: warm, moves all, Shiner edema. Small lipoma located on outside of left ankle, Ruz tenderness.  Msk: normal ROM, Lapaglia edema  Neuro: alert, oriented to person, place, and time. Drakes CN deficits.  Skin: Tattoos on left forearm and both arms. PPD placed on left forearm.    Lab Results: Basic Metabolic Panel:  Recent Labs Lab 09/26/13 0504 09/26/13 1530 09/27/13 0436  NA 140 142 142  K 5.6* 5.0 4.6  CL 101 101 100  CO2 21 20 26   GLUCOSE 81 69* 69*  BUN 57* 62* 18  CREATININE 11.10* 12.04* 5.57*  CALCIUM 10.3 9.9 9.3  PHOS  --  5.7*  --    Liver Function Tests:  Recent Labs Lab 09/26/13 0504 09/26/13 1530  AST 8  --   ALT 9  --   ALKPHOS 53  --   BILITOT 0.4  --   PROT 6.6  --   ALBUMIN 3.5 3.2*   Saulters results found for this basename: LIPASE, AMYLASE,  in the last 168 hours Thaker results found for this basename:  AMMONIA,  in the last 168 hours CBC:  Recent Labs Lab 09/26/13 1530 09/27/13 0436  WBC 6.0 5.6  HGB 8.3* 8.9*  HCT 25.3* 26.8*  MCV 86.6 86.5  PLT 94* 101*   Cardiac Enzymes: Suell results found for this basename: CKTOTAL, CKMB, CKMBINDEX, TROPONINI,  in the last 168 hours BNP: Chandonnet results found for this basename: PROBNP,  in the last 168 hours D-Dimer: Alcock results found for this basename: DDIMER,  in the last 168 hours CBG: Stream results found for this basename: GLUCAP,  in the last 168 hours Hemoglobin A1C: Rothbauer results found for this basename: HGBA1C,  in the last 168 hours Fasting Lipid Panel: Stender results found for this basename: CHOL, HDL, LDLCALC, TRIG, CHOLHDL, LDLDIRECT,  in the last 168 hours Thyroid Function Tests: Stein results found for this basename: TSH, T4TOTAL, FREET4, T3FREE, THYROIDAB,  in the last 168 hours Coagulation: Kimberlin results found for this basename: LABPROT, INR,  in the last 168 hours Anemia Panel:  Recent Labs Lab 09/26/13 1058  FERRITIN 938*  TIBC 190*  IRON 103   Urine Drug Screen: Drugs of Abuse  Mccomb results found for this  basename: labopia, cocainscrnur, labbenz, amphetmu, thcu, labbarb    Alcohol Level: Bommarito results found for this basename: ETH,  in the last 168 hours Urinalysis: Glock results found for this basename: COLORURINE, APPERANCEUR, LABSPEC, PHURINE, GLUCOSEU, HGBUR, BILIRUBINUR, KETONESUR, PROTEINUR, UROBILINOGEN, NITRITE, LEUKOCYTESUR,  in the last 168 hours   Micro Results: Glasper results found for this or any previous visit (from the past 240 hour(s)). Studies/Results: Woodroof results found. Medications: I have reviewed the patient's current medications. Scheduled Meds: . amLODipine  10 mg Oral Daily  . [START ON 10/01/2013] cefUROXime (ZINACEF)  IV  1.5 g Intravenous On Call to OR  . darbepoetin (ARANESP) injection - DIALYSIS  60 mcg Intravenous Q Fri-HD  . lanthanum  500 mg Oral TID WC  . lisinopril  10 mg Oral Daily  . sodium chloride  3  mL Intravenous Q12H   Continuous Infusions:  PRN Meds:.acetaminophen, acetaminophen, hydrALAZINE Assessment/Plan: Mr. Bowermaster is a 48yo man, refugee from [place], [place] speaker, with PMH of ESRD on HD 3 times weekly and HTN presenting to establish care in [place].   1. ESRD on HD - The patient is a refugee from [place], with last HD July 9th prior to his arrival to the [place]. Per medical records from [place], his first HD was in December 2014 at [facility] likely due to uncontrolled hypertension. His vascular access has been Right IJ which was placed in December 2014.Last HD screening labs in 6/30 drawn at [facility] with HIV 1/2 non reactive, hep B antigen nonreactive, hep B antibody >1000.0 mIU/ml, anti-HCV non reactive (all these repeat labs have been negative). CXR from March 5th 2015 with "right IJ cath in-situ with borderline cardiomegaly". CXR here shows cardiomegaly, with Utley focal region of consolidation nor focal infiltrates, interstitial findings consistent with pulmonary edema.  - Continue telemetry  - Nephrology consulted, appreciate help with inpatient HD and setting up outpatient HD  - Renal diet--pt prefers rice and vegetables and Madarang beef or pork products  - Vascular Surgery consulted for AVF placement (vein mapping completed 09/27/13). Plan for left radial-cephalic AV fistula on Wednesday 7/15. - Hep A and B vaccines given on 09/27/13 - PPD positive, 15x47mm, red, raised. Quantiferon pending. 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  2. HTN - BP as high as 200/116 on admission before HD but is now normal. His usual antihypertensive regimen include amlodipine 10mg  daily and enalapril 10mg  daily but it is unclear when he last took his medications.  - Continue amlodipine 10mg  PO daily and Lisinopril 10mg  PO daily  - Hydralazine 5mg  PRN for SPB >180   3. Anemia of chronic disease - Last Hg on 7/6 at 6.6>>8.9 , per [place] medical report it appears that he had  been receiving Erythropoietin 6000 U once per week since Jun 22 and received 2 units of pRBC during his last HD session on July 9th. Iron panel shows Fe 103, TIBC 190, Ferritin 938 which is consistent with anemia of chronic disease as expected.  - Continue to monitor   4. Thrombocytopenia - Chronic issue for him. Platelets of 91>>101. Basher bleeding noted.  - Continue to monitor   5. Refugee status - Ellen signs of active TB. PPD placed, results7/12. IOM contact is Leanora Ivanoff [phone]. Dr. Hayes Ludwig spoke with her and she states that the patient already has Medicaid application in process as well as housing, food, and other community help. She will be in contact with the [facility] social worker and case managers.  Prior to his departure from [place], the patient has been deemed independent of his ADLs and will likely not need ALF or SNF placement but we will await formal PT/OT evaluation prior to his housing arrangement is finalized.  - Social work/Care management consulted for placement/housing assistance  - PT/OT consulted --> pt able to complete all ADLs independently  Diet: Renal diet, Schnackenberg beef or pork products  DVT prophylaxis: SCDs  Dispo: Disposition is deferred at this time, awaiting improvement of current medical problems. Anticipated discharge in approximately 7 day(s).    The patient does not have a current PCP ([facility] Pcp Per Patient) and does need an [facility] hospital follow-up appointment after discharge.  The patient does not have transportation limitations that hinder transportation to clinic appointments.  .Services Needed at time of discharge: Y = Yes, Blank = Walkins PT:   OT:   RN:   Equipment:   Other:     LOS: 3 days   [name], MD 09/29/2013, 11:18 AM

## 2013-09-30 ENCOUNTER — Encounter (HOSPITAL_COMMUNITY): Payer: Self-pay | Admitting: Oncology

## 2013-09-30 DIAGNOSIS — Z992 Dependence on renal dialysis: Secondary | ICD-10-CM

## 2013-09-30 DIAGNOSIS — N186 End stage renal disease: Secondary | ICD-10-CM

## 2013-09-30 DIAGNOSIS — R7611 Nonspecific reaction to tuberculin skin test without active tuberculosis: Secondary | ICD-10-CM | POA: Clinically undetermined

## 2013-09-30 HISTORY — DX: Dependence on renal dialysis: Z99.2

## 2013-09-30 HISTORY — DX: End stage renal disease: N18.6

## 2013-09-30 HISTORY — DX: Nonspecific reaction to tuberculin skin test without active tuberculosis: R76.11

## 2013-09-30 LAB — RENAL FUNCTION PANEL
Albumin: 3.6 g/dL (ref 3.5–5.2)
Anion gap: 20 — ABNORMAL HIGH (ref 5–15)
BUN: 61 mg/dL — ABNORMAL HIGH (ref 6–23)
CO2: 24 meq/L (ref 19–32)
CREATININE: 9.84 mg/dL — AB (ref 0.50–1.35)
Calcium: 10.1 mg/dL (ref 8.4–10.5)
Chloride: 94 mEq/L — ABNORMAL LOW (ref 96–112)
GFR calc Af Amer: 6 mL/min — ABNORMAL LOW (ref >90)
GFR calc non Af Amer: 5 mL/min — ABNORMAL LOW (ref >90)
Glucose, Bld: 88 mg/dL (ref 70–99)
Phosphorus: 5.3 mg/dL — ABNORMAL HIGH (ref 2.3–4.6)
Potassium: 4.2 mEq/L (ref 3.7–5.3)
Sodium: 138 mEq/L (ref 137–147)

## 2013-09-30 LAB — QUANTIFERON TB GOLD ASSAY (BLOOD)
Interferon Gamma Release Assay: POSITIVE — AB
Mitogen value: 9.24 IU/mL
QUANTIFERON NIL VALUE: 0.05 [IU]/mL
TB ANTIGEN MINUS NIL VALUE: 5.11 [IU]/mL
TB Ag value: 5.16 IU/mL

## 2013-09-30 LAB — CBC
HCT: 27.3 % — ABNORMAL LOW (ref 39.0–52.0)
Hemoglobin: 9.1 g/dL — ABNORMAL LOW (ref 13.0–17.0)
MCH: 28.3 pg (ref 26.0–34.0)
MCHC: 33.3 g/dL (ref 30.0–36.0)
MCV: 84.8 fL (ref 78.0–100.0)
Platelets: 146 10*3/uL — ABNORMAL LOW (ref 150–400)
RBC: 3.22 MIL/uL — ABNORMAL LOW (ref 4.22–5.81)
RDW: 13.9 % (ref 11.5–15.5)
WBC: 6.3 10*3/uL (ref 4.0–10.5)

## 2013-09-30 MED ORDER — ISONIAZID 300 MG PO TABS
300.0000 mg | ORAL_TABLET | Freq: Every day | ORAL | Status: DC
  Administered 2013-09-30 – 2013-10-09 (×9): 300 mg via ORAL
  Filled 2013-09-30 (×10): qty 1

## 2013-09-30 MED ORDER — VITAMIN B-6 50 MG PO TABS
50.0000 mg | ORAL_TABLET | Freq: Every day | ORAL | Status: DC
  Administered 2013-09-30 – 2013-10-09 (×9): 50 mg via ORAL
  Filled 2013-09-30 (×11): qty 1

## 2013-09-30 MED ORDER — HEPARIN SODIUM (PORCINE) 1000 UNIT/ML DIALYSIS: 20.0000 [IU]/kg | INTRAMUSCULAR | Status: DC | PRN

## 2013-09-30 NOTE — Progress Notes (Addendum)
Pt had a quantiferon test that was positive and correlated with positive PPD. The patient had an admission CXR that was negative for any mass or suspicion of TB. Pt has been asymptomatic since his presentation and review of his records from his exit from [place] also show the patient to have Markovitz cough, SOB, night sweats, or unexplained weight loss.   This was further personally discussed with Dr. Tommy Medal of ID in regards to management and recommended that the patient be treated with isoniazid for 9 months in line with latent TB treatment. There is Rathbun need for negative pressure or respiratory isolation at this time.   Clinton Gallant, MD PGY-3

## 2013-09-30 NOTE — Progress Notes (Signed)
Telephone call to [facility] after patient returned from acute care hemodialysis department. Spoke with interpreter [phone]. Pt advised of receiving 2 blood pressure medications, and that he would be receiving a hot lunch now that he was back from hemodialysis. Pt was reminded that he would not be able to eat or drink anything after midnight tonight d/t OR for L AVF creation. Pt denied having any pain, stated he had 2 BMs this morning, and he did not have any questions. Pt educated about increased risk for constipation d/t hemodialysis lifestyle.  Will continue to monitor. Manya Silvas, RN

## 2013-09-30 NOTE — Progress Notes (Signed)
Subjective: Conversation performed via telephone translation services with Chief Financial Officer. Patient has Manwarren complaints today. He denies fever, chills, abdominal pain, N/V. He will have dialysis today and left radio-cephalic AV fistula surgery tomorrow.    Objective: Vital signs in last 24 hours: Filed Vitals:   09/29/13 0535 09/29/13 0917 09/29/13 2002 09/30/13 0513  BP: 158/86 170/102 149/95 158/86  Pulse: 55 60 63 63  Temp: 97.8 F (36.6 C) 97.9 F (36.6 C) 97.4 F (36.3 C) 98.3 F (36.8 C)  TempSrc: Oral Oral    Resp: 18 18 18 17   Weight:   119 lb 0.8 oz (54 kg)   SpO2: 99% 100% 98% 99%   Weight change: 1 lb 15.7 oz (0.9 kg)  Intake/Output Summary (Last 24 hours) at 09/30/13 0813 Last data filed at 09/30/13 0045  Gross per 24 hour  Intake    600 ml  Output      3 ml  Net    597 ml   Physical Exam General: alert, cooperative, converses in Burmese, sitting up in bed  Neck: Supple, Laine JVD. Right IJ catheter in place, covered by clean bandage  CV: RRR, normal S1/S2, Jaggers m/g/r  Pulm: CTA bilaterally, Quimby wheezes, Cooler cough  Ext: warm, moves all, Pietrzak edema. Small lipoma located on outside of left ankle, Chronis tenderness.  Msk: normal ROM, Uselman edema  Neuro: alert, oriented to person, place, and time. Dunlap CN deficits.  Skin: Tattoos on left forearm and both arms. PPD placed on left forearm, erythematous.   Lab Results: Basic Metabolic Panel:  Recent Labs Lab 09/26/13 0504 09/26/13 1530 09/27/13 0436  NA 140 142 142  K 5.6* 5.0 4.6  CL 101 101 100  CO2 21 20 26   GLUCOSE 81 69* 69*  BUN 57* 62* 18  CREATININE 11.10* 12.04* 5.57*  CALCIUM 10.3 9.9 9.3  PHOS  --  5.7*  --    Liver Function Tests:  Recent Labs Lab 09/26/13 0504 09/26/13 1530  AST 8  --   ALT 9  --   ALKPHOS 53  --   BILITOT 0.4  --   PROT 6.6  --   ALBUMIN 3.5 3.2*   Mcclafferty results found for this basename: LIPASE, AMYLASE,  in the last 168 hours Woolford results found for this basename: AMMONIA,  in the  last 168 hours CBC:  Recent Labs Lab 09/26/13 1530 09/27/13 0436  WBC 6.0 5.6  HGB 8.3* 8.9*  HCT 25.3* 26.8*  MCV 86.6 86.5  PLT 94* 101*   Cardiac Enzymes: Sontag results found for this basename: CKTOTAL, CKMB, CKMBINDEX, TROPONINI,  in the last 168 hours BNP: Baby results found for this basename: PROBNP,  in the last 168 hours D-Dimer: Mailloux results found for this basename: DDIMER,  in the last 168 hours CBG: Cheever results found for this basename: GLUCAP,  in the last 168 hours Hemoglobin A1C: Donner results found for this basename: HGBA1C,  in the last 168 hours Fasting Lipid Panel: Ballen results found for this basename: CHOL, HDL, LDLCALC, TRIG, CHOLHDL, LDLDIRECT,  in the last 168 hours Thyroid Function Tests: Brabec results found for this basename: TSH, T4TOTAL, FREET4, T3FREE, THYROIDAB,  in the last 168 hours Coagulation: Goldfarb results found for this basename: LABPROT, INR,  in the last 168 hours Anemia Panel:  Recent Labs Lab 09/26/13 1058  FERRITIN 938*  TIBC 190*  IRON 103   Urine Drug Screen: Drugs of Abuse  Leedy results found for this basename: labopia,  cocainscrnur, labbenz, amphetmu, thcu, labbarb    Alcohol Level: Macauley results found for this basename: ETH,  in the last 168 hours Urinalysis: Daughtry results found for this basename: COLORURINE, APPERANCEUR, LABSPEC, [place], GLUCOSEU, HGBUR, BILIRUBINUR, KETONESUR, PROTEINUR, UROBILINOGEN, NITRITE, LEUKOCYTESUR,  in the last 168 hours   Micro Results: Lowden results found for this or any previous visit (from the past 240 hour(s)). Studies/Results: Crayton results found. Medications: I have reviewed the patient's current medications. Scheduled Meds: . amLODipine  10 mg Oral Daily  . [START ON 10/01/2013] cefUROXime (ZINACEF)  IV  1.5 g Intravenous On Call to OR  . darbepoetin (ARANESP) injection - DIALYSIS  60 mcg Intravenous Q Fri-HD  . lanthanum  500 mg Oral TID WC  . lisinopril  10 mg Oral Daily  . sodium chloride  3 mL Intravenous  Q12H   Continuous Infusions:  PRN Meds:.acetaminophen, acetaminophen, hydrALAZINE Assessment/Plan: Mr. Stabenow is a 48yo man, refugee from [place], [place] speaker, with PMH of ESRD on HD 3 times weekly and HTN presenting to establish care in [place].   1. ESRD on HD - The patient is a refugee from [place], with last HD July 9th prior to his arrival to the [place]. Per medical records from [place], his first HD was in December 2014 at [facility] likely due to uncontrolled hypertension. His vascular access has been Right IJ which was placed in December 2014. Last HD screening labs in 6/30 drawn at [facility] with HIV 1/2 non reactive, hep B antigen nonreactive, hep B antibody >1000.0 mIU/ml, anti-HCV non reactive (all these repeat labs have been negative). CXR from March 5th 2015 with "right IJ cath in-situ with borderline cardiomegaly". CXR here shows cardiomegaly, with Colmenares focal region of consolidation nor focal infiltrates, interstitial findings consistent with pulmonary edema.  - Continue telemetry  - Nephrology consulted, appreciate help with inpatient HD and setting up outpatient HD  - Renal diet--pt prefers rice and vegetables and Shock beef or pork products  - Vascular Surgery consulted for AVF placement (vein mapping completed 09/27/13). Plan for left radial-cephalic AV fistula on Wednesday 7/15.  - Hep A and B vaccines given on 09/27/13  - PPD positive, 15x63mm, red, raised. Quantiferon pending. 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 CXR on 7/10 negative.   2. HTN - BP as high as 200/116 on admission before HD but is now normal. His usual antihypertensive regimen include amlodipine 10mg  daily and enalapril 10mg  daily but it is unclear when he last took his medications.  - Continue Amlodipine 10mg  PO daily and Lisinopril 10mg  PO daily  - Hydralazine 5mg  PRN for SPB >180   3. Anemia of chronic disease - Last Hg on 7/6 at 6.6>>8.9 , per [place] medical report it appears  that he had been receiving Erythropoietin 6000 U once per week since Jun 22 and received 2 units of pRBC during his last HD session on July 9th. Iron panel shows Fe 103, TIBC 190, Ferritin 938 which is consistent with anemia of chronic disease as expected.  - Continue to monitor   4. Thrombocytopenia - Chronic issue for him. Platelets of 91>>101. Morsch bleeding noted.  - Continue to monitor   5. Refugee status - [place] signs of active TB. PPD placed, results7/12. IOM contact is Leanora Ivanoff [phone]. Dr. Hayes Ludwig spoke with her and she states that the patient already has Medicaid application in process as well as housing, food, and other community help. She will be in contact with the  [facility] social worker and case Publishing rights manager. Prior to his departure from [place], the patient has been deemed independent of his ADLs and will likely not need ALF or SNF placement but we will await formal PT/OT evaluation prior to his housing arrangement is finalized.  - Social work/Care management consulted for placement/housing assistance  - PT/OT consulted --> pt able to complete all ADLs independently   Diet: Renal diet, Vallie beef or pork products  DVT prophylaxis: SCDs  Dispo: Disposition is deferred at this time, awaiting improvement of current medical problems. Anticipated discharge in approximately 7 day(s).    The patient does not have a current PCP ([facility] Pcp Per Patient) and does need an [facility] hospital follow-up appointment after discharge.  The patient does not have transportation limitations that hinder transportation to clinic appointments.  .Services Needed at time of discharge: Y = Yes, Blank = [place] PT:   OT:   RN:   Equipment:   Other:     LOS: 4 days   [name], MD 09/30/2013, 8:13 AM

## 2013-09-30 NOTE — Progress Notes (Signed)
Patient ID: Keith Vaughan, male   DOB: Aug 22, 1965, 48 y.o.   MRN: [id number] Attending physician note: I examined this patient together with resident physician Dr. [name] and I concur with her assessment and management plan. He continues on dialysis for his end-stage renal disease. He will have an  AV fistula graft placed tomorrow, July 15 by vascular surgery.

## 2013-09-30 NOTE — Plan of Care (Signed)
Problem: Phase I Progression Outcomes Goal: Initial discharge plan identified Outcome: Completed/Met Date Met:  09/30/13 Pt is participating in refugee program directed by [facility] Nurses. Housing and follow-up medical care arranged. Discharge is anticipated Thursday, July16 after hemodialysis.

## 2013-09-30 NOTE — Plan of Care (Signed)
Problem: Phase I Progression Outcomes Goal: Other Phase I Outcomes/Goals Outcome: Completed/Met Date Met:  09/30/13 Permanent vascular access to be placed tomorrow, Wedneday July 15.

## 2013-09-30 NOTE — Procedures (Signed)
I was present at this dialysis session. I have reviewed the session itself and made appropriate changes.   Pearson Grippe  MD 09/30/2013, 10:59 AM

## 2013-09-30 NOTE — Plan of Care (Signed)
Problem: Consults Goal: Chronic Renal Failure Patient Education See Patient Education Module for education specifics.  Outcome: Progressing Education provided to pt via Chief Financial Officer by [employer], including nursing, pharmacy, PT, MDs. Education has included hemodialysis treatments, medications, vascular access, and risk for constipation.

## 2013-10-01 ENCOUNTER — Inpatient Hospital Stay (HOSPITAL_COMMUNITY): Payer: Medicaid Other

## 2013-10-01 ENCOUNTER — Encounter (HOSPITAL_COMMUNITY)
Admission: EM | Disposition: A | Discharge status: Home / Self Care | Payer: Self-pay | Source: Home / Self Care | Attending: Oncology

## 2013-10-01 ENCOUNTER — Inpatient Hospital Stay (HOSPITAL_COMMUNITY): Payer: Medicaid Other | Admitting: Anesthesiology

## 2013-10-01 ENCOUNTER — Encounter (HOSPITAL_COMMUNITY): Payer: Self-pay | Admitting: Anesthesiology

## 2013-10-01 ENCOUNTER — Telehealth: Payer: Self-pay | Admitting: Vascular Surgery

## 2013-10-01 ENCOUNTER — Encounter (HOSPITAL_COMMUNITY): Payer: Medicaid Other | Admitting: Anesthesiology

## 2013-10-01 ENCOUNTER — Other Ambulatory Visit: Payer: Self-pay | Admitting: *Deleted

## 2013-10-01 DIAGNOSIS — Z4931 Encounter for adequacy testing for hemodialysis: Secondary | ICD-10-CM

## 2013-10-01 DIAGNOSIS — N186 End stage renal disease: Secondary | ICD-10-CM

## 2013-10-01 HISTORY — PX: INSERTION OF DIALYSIS CATHETER: SHX1324

## 2013-10-01 HISTORY — PX: AV FISTULA PLACEMENT: SHX1204

## 2013-10-01 LAB — CBC
HCT: 29.1 % — ABNORMAL LOW (ref 39.0–52.0)
Hemoglobin: 9.5 g/dL — ABNORMAL LOW (ref 13.0–17.0)
MCH: 28 pg (ref 26.0–34.0)
MCHC: 32.6 g/dL (ref 30.0–36.0)
MCV: 85.8 fL (ref 78.0–100.0)
Platelets: 124 10*3/uL — ABNORMAL LOW (ref 150–400)
RBC: 3.39 MIL/uL — ABNORMAL LOW (ref 4.22–5.81)
RDW: 14.2 % (ref 11.5–15.5)
WBC: 7 10*3/uL (ref 4.0–10.5)

## 2013-10-01 LAB — SURGICAL PCR SCREEN
MRSA, PCR: NEGATIVE
Staphylococcus aureus: NEGATIVE

## 2013-10-01 LAB — BASIC METABOLIC PANEL
Anion gap: 17 — ABNORMAL HIGH (ref 5–15)
BUN: 26 mg/dL — AB (ref 6–23)
CO2: 26 meq/L (ref 19–32)
Calcium: 10.5 mg/dL (ref 8.4–10.5)
Chloride: 94 mEq/L — ABNORMAL LOW (ref 96–112)
Creatinine, Ser: 6.02 mg/dL — ABNORMAL HIGH (ref 0.50–1.35)
GFR calc Af Amer: 11 mL/min — ABNORMAL LOW (ref >90)
GFR calc non Af Amer: 10 mL/min — ABNORMAL LOW (ref >90)
Glucose, Bld: 90 mg/dL (ref 70–99)
POTASSIUM: 4 meq/L (ref 3.7–5.3)
Sodium: 137 mEq/L (ref 137–147)

## 2013-10-01 SURGERY — ARTERIOVENOUS (AV) FISTULA CREATION
Anesthesia: General | Site: Arm Lower | Laterality: Right

## 2013-10-01 MED ORDER — PROPOFOL 10 MG/ML IV BOLUS
INTRAVENOUS | Status: AC
  Filled 2013-10-01: qty 20

## 2013-10-01 MED ORDER — FENTANYL CITRATE 0.05 MG/ML IJ SOLN
INTRAMUSCULAR | Status: DC | PRN
  Administered 2013-10-01: 50 ug via INTRAVENOUS

## 2013-10-01 MED ORDER — SODIUM CHLORIDE 0.9 % IR SOLN
Status: DC | PRN
  Administered 2013-10-01: 11:00:00

## 2013-10-01 MED ORDER — HEPARIN SODIUM (PORCINE) 1000 UNIT/ML IJ SOLN
INTRAMUSCULAR | Status: AC
  Filled 2013-10-01: qty 1

## 2013-10-01 MED ORDER — PHENYLEPHRINE 40 MCG/ML (10ML) SYRINGE FOR IV PUSH (FOR BLOOD PRESSURE SUPPORT)
PREFILLED_SYRINGE | INTRAVENOUS | Status: AC
  Filled 2013-10-01: qty 10

## 2013-10-01 MED ORDER — PROPOFOL 10 MG/ML IV BOLUS
INTRAVENOUS | Status: DC | PRN
  Administered 2013-10-01: 150 mg via INTRAVENOUS

## 2013-10-01 MED ORDER — SODIUM CHLORIDE 0.9 % IV SOLN
INTRAVENOUS | Status: DC | PRN
  Administered 2013-10-01: 10:00:00 via INTRAVENOUS

## 2013-10-01 MED ORDER — METOCLOPRAMIDE HCL 5 MG/ML IJ SOLN: 10.0000 mg | Freq: Once | INTRAMUSCULAR | Status: DC | PRN

## 2013-10-01 MED ORDER — FENTANYL CITRATE 0.05 MG/ML IJ SOLN
INTRAMUSCULAR | Status: AC
  Administered 2013-10-01: 25 ug via INTRAVENOUS
  Filled 2013-10-01: qty 2

## 2013-10-01 MED ORDER — FENTANYL CITRATE 0.05 MG/ML IJ SOLN
INTRAMUSCULAR | Status: AC
  Filled 2013-10-01: qty 5

## 2013-10-01 MED ORDER — DEXTROSE 5 % IV SOLN
INTRAVENOUS | Status: AC
  Filled 2013-10-01: qty 1.5

## 2013-10-01 MED ORDER — ONDANSETRON HCL 4 MG/2ML IJ SOLN
INTRAMUSCULAR | Status: DC | PRN
  Administered 2013-10-01: 4 mg via INTRAVENOUS

## 2013-10-01 MED ORDER — ONDANSETRON HCL 4 MG/2ML IJ SOLN
INTRAMUSCULAR | Status: AC
  Filled 2013-10-01: qty 2

## 2013-10-01 MED ORDER — MIDAZOLAM HCL 5 MG/5ML IJ SOLN
INTRAMUSCULAR | Status: DC | PRN
  Administered 2013-10-01: 2 mg via INTRAVENOUS

## 2013-10-01 MED ORDER — MIDAZOLAM HCL 2 MG/2ML IJ SOLN
INTRAMUSCULAR | Status: AC
  Filled 2013-10-01: qty 2

## 2013-10-01 MED ORDER — PHENYLEPHRINE HCL 10 MG/ML IJ SOLN
INTRAMUSCULAR | Status: DC | PRN
  Administered 2013-10-01 (×2): 80 ug via INTRAVENOUS

## 2013-10-01 MED ORDER — OXYCODONE HCL 5 MG PO TABS: 5.0000 mg | ORAL_TABLET | Freq: Once | ORAL | Status: DC | PRN

## 2013-10-01 MED ORDER — OXYCODONE HCL 5 MG PO TABS
5.0000 mg | ORAL_TABLET | Freq: Four times a day (QID) | ORAL | Status: DC | PRN
  Administered 2013-10-01 – 2013-10-07 (×4): 5 mg via ORAL
  Filled 2013-10-01 (×3): qty 1

## 2013-10-01 MED ORDER — OXYCODONE HCL 5 MG/5ML PO SOLN: 5.0000 mg | Freq: Once | ORAL | Status: DC | PRN

## 2013-10-01 MED ORDER — LIDOCAINE HCL (CARDIAC) 20 MG/ML IV SOLN
INTRAVENOUS | Status: AC
  Filled 2013-10-01: qty 5

## 2013-10-01 MED ORDER — FENTANYL CITRATE 0.05 MG/ML IJ SOLN
25.0000 ug | INTRAMUSCULAR | Status: DC | PRN
  Administered 2013-10-01 (×2): 25 ug via INTRAVENOUS

## 2013-10-01 MED ORDER — SODIUM CHLORIDE 0.9 % IV SOLN
INTRAVENOUS | Status: DC
  Administered 2013-10-01: 35 mL/h via INTRAVENOUS

## 2013-10-01 MED ORDER — HEPARIN SODIUM (PORCINE) 1000 UNIT/ML IJ SOLN
INTRAMUSCULAR | Status: DC | PRN
  Administered 2013-10-01: 1000 [IU]

## 2013-10-01 MED ORDER — LIDOCAINE-EPINEPHRINE (PF) 1 %-1:200000 IJ SOLN
INTRAMUSCULAR | Status: DC | PRN
  Administered 2013-10-01: 30 mL

## 2013-10-01 MED ORDER — 0.9 % SODIUM CHLORIDE (POUR BTL) OPTIME
TOPICAL | Status: DC | PRN
  Administered 2013-10-01: 1000 mL

## 2013-10-01 MED ORDER — LIDOCAINE HCL (CARDIAC) 20 MG/ML IV SOLN
INTRAVENOUS | Status: DC | PRN
  Administered 2013-10-01: 50 mg via INTRAVENOUS

## 2013-10-01 SURGICAL SUPPLY — 62 items
ARMBAND PINK RESTRICT EXTREMIT (MISCELLANEOUS) ×4 IMPLANT
BAG DECANTER FOR FLEXI CONT (MISCELLANEOUS) IMPLANT
BLADE 10 SAFETY STRL DISP (BLADE) IMPLANT
CANISTER SUCTION 2500CC (MISCELLANEOUS) ×4 IMPLANT
CATH CANNON HEMO 15F 50CM (CATHETERS) IMPLANT
CATH CANNON HEMO 15FR 19 (HEMODIALYSIS SUPPLIES) ×4 IMPLANT
CATH CANNON HEMO 15FR 23CM (HEMODIALYSIS SUPPLIES) IMPLANT
CATH CANNON HEMO 15FR 31CM (HEMODIALYSIS SUPPLIES) IMPLANT
CATH CANNON HEMO 15FR 32CM (HEMODIALYSIS SUPPLIES) IMPLANT
CLIP TI MEDIUM 6 (CLIP) ×4 IMPLANT
CLIP TI WIDE RED SMALL 6 (CLIP) ×4 IMPLANT
COVER PROBE W GEL 5X96 (DRAPES) IMPLANT
COVER SURGICAL LIGHT HANDLE (MISCELLANEOUS) ×4 IMPLANT
DECANTER SPIKE VIAL GLASS SM (MISCELLANEOUS) ×4 IMPLANT
DERMABOND ADVANCED (GAUZE/BANDAGES/DRESSINGS) ×2
DERMABOND ADVANCED .7 DNX12 (GAUZE/BANDAGES/DRESSINGS) ×2 IMPLANT
DRAIN PENROSE 1/4X12 LTX STRL (WOUND CARE) ×4 IMPLANT
DRAPE C-ARM 42X72 X-RAY (DRAPES) ×4 IMPLANT
DRAPE CHEST BREAST 15X10 FENES (DRAPES) ×4 IMPLANT
ELECT REM PT RETURN 9FT ADLT (ELECTROSURGICAL) ×4
ELECTRODE REM PT RTRN 9FT ADLT (ELECTROSURGICAL) ×2 IMPLANT
GAUZE SPONGE 2X2 8PLY STRL LF (GAUZE/BANDAGES/DRESSINGS) ×2 IMPLANT
GAUZE SPONGE 4X4 16PLY XRAY LF (GAUZE/BANDAGES/DRESSINGS) IMPLANT
GEL ULTRASOUND 20GR AQUASONIC (MISCELLANEOUS) IMPLANT
GLOVE BIO SURGEON STRL SZ 6.5 (GLOVE) ×6 IMPLANT
GLOVE BIO SURGEONS STRL SZ 6.5 (GLOVE) ×2
GLOVE BIOGEL PI IND STRL 6.5 (GLOVE) ×2 IMPLANT
GLOVE BIOGEL PI IND STRL 7.0 (GLOVE) ×2 IMPLANT
GLOVE BIOGEL PI IND STRL 7.5 (GLOVE) ×2 IMPLANT
GLOVE BIOGEL PI INDICATOR 6.5 (GLOVE) ×2
GLOVE BIOGEL PI INDICATOR 7.0 (GLOVE) ×2
GLOVE BIOGEL PI INDICATOR 7.5 (GLOVE) ×2
GLOVE ECLIPSE 6.0 STRL STRAW (GLOVE) ×4 IMPLANT
GLOVE SS BIOGEL STRL SZ 7 (GLOVE) ×4 IMPLANT
GLOVE SUPERSENSE BIOGEL SZ 7 (GLOVE) ×4
GOWN STRL REUS W/ TWL LRG LVL3 (GOWN DISPOSABLE) ×8 IMPLANT
GOWN STRL REUS W/TWL LRG LVL3 (GOWN DISPOSABLE) ×8
KIT BASIN OR (CUSTOM PROCEDURE TRAY) ×4 IMPLANT
KIT ROOM TURNOVER OR (KITS) ×4 IMPLANT
NEEDLE 18GX1X1/2 (RX/OR ONLY) (NEEDLE) ×4 IMPLANT
NEEDLE 22X1 1/2 (OR ONLY) (NEEDLE) ×4 IMPLANT
NEEDLE HYPO 25GX1X1/2 BEV (NEEDLE) ×4 IMPLANT
NS IRRIG 1000ML POUR BTL (IV SOLUTION) ×4 IMPLANT
PACK CV ACCESS (CUSTOM PROCEDURE TRAY) ×4 IMPLANT
PACK SURGICAL SETUP 50X90 (CUSTOM PROCEDURE TRAY) ×4 IMPLANT
PAD ARMBOARD 7.5X6 YLW CONV (MISCELLANEOUS) ×8 IMPLANT
SOAP 2 % CHG 4 OZ (WOUND CARE) ×4 IMPLANT
SPONGE GAUZE 2X2 STER 10/PKG (GAUZE/BANDAGES/DRESSINGS) ×2
SUT ETHILON 3 0 PS 1 (SUTURE) ×4 IMPLANT
SUT PROLENE 6 0 BV (SUTURE) ×4 IMPLANT
SUT VIC AB 3-0 SH 27 (SUTURE) ×4
SUT VIC AB 3-0 SH 27X BRD (SUTURE) ×4 IMPLANT
SUT VICRYL 4-0 PS2 18IN ABS (SUTURE) ×4 IMPLANT
SYR 20CC LL (SYRINGE) ×4 IMPLANT
SYR 5ML LL (SYRINGE) ×8 IMPLANT
SYR CONTROL 10ML LL (SYRINGE) IMPLANT
SYRINGE 10CC LL (SYRINGE) ×4 IMPLANT
TAPE CLOTH SURG 4X10 WHT LF (GAUZE/BANDAGES/DRESSINGS) ×4 IMPLANT
TOWEL OR 17X24 6PK STRL BLUE (TOWEL DISPOSABLE) ×4 IMPLANT
TOWEL OR 17X26 10 PK STRL BLUE (TOWEL DISPOSABLE) ×4 IMPLANT
UNDERPAD 30X30 INCONTINENT (UNDERPADS AND DIAPERS) ×4 IMPLANT
WATER STERILE IRR 1000ML POUR (IV SOLUTION) ×4 IMPLANT

## 2013-10-01 NOTE — Progress Notes (Signed)
Notified MD that patient currently has Mcgaughey VTE prophylaxis. Mallory MD made aware. Per MD, will discuss with team. Velora Mediate

## 2013-10-01 NOTE — Progress Notes (Signed)
Subjective: Patient undergoing left radiocephalic AV fistula placement today. Dialysis yesterday.   Objective: Vital signs in last 24 hours: Filed Vitals:   09/30/13 1328 09/30/13 1714 09/30/13 1940 10/01/13 0526  BP: 165/108 147/92 145/87 138/84  Pulse: 58 60 54 62  Temp: 97.7 F (36.5 C) 98.1 F (36.7 C) 97.7 F (36.5 C) 98.1 F (36.7 C)  TempSrc: Oral Oral    Resp: 20 18 16 16   Weight: 114 lb 10.2 oz (52 kg)  113 lb 8.6 oz (51.5 kg)   SpO2: 97% 97% 97% 97%   Weight change: 1 lb 15.8 oz (0.9 kg)  Intake/Output Summary (Last 24 hours) at 10/01/13 1151 Last data filed at 09/30/13 1425  Gross per 24 hour  Intake      3 ml  Output   2000 ml  Net  -1997 ml   Physical Exam Not performed- patient in surgery.  Lab Results: Basic Metabolic Panel:  Recent Labs Lab 09/26/13 1530  09/30/13 0917 10/01/13 0345  NA 142  < > 138 137  K 5.0  < > 4.2 4.0  CL 101  < > 94* 94*  CO2 20  < > 24 26  GLUCOSE 69*  < > 88 90  BUN 62*  < > 61* 26*  CREATININE 12.04*  < > 9.84* 6.02*  CALCIUM 9.9  < > 10.1 10.5  PHOS 5.7*  --  5.3*  --   < > = values in this interval not displayed. Liver Function Tests:  Recent Labs Lab 09/26/13 0504 09/26/13 1530 09/30/13 0917  AST 8  --   --   ALT 9  --   --   ALKPHOS 53  --   --   BILITOT 0.4  --   --   PROT 6.6  --   --   ALBUMIN 3.5 3.2* 3.6   Castrogiovanni results found for this basename: LIPASE, AMYLASE,  in the last 168 hours Fambro results found for this basename: AMMONIA,  in the last 168 hours CBC:  Recent Labs Lab 09/30/13 0917 10/01/13 0345  WBC 6.3 7.0  HGB 9.1* 9.5*  HCT 27.3* 29.1*  MCV 84.8 85.8  PLT 146* 124*   Cardiac Enzymes: Lear results found for this basename: CKTOTAL, CKMB, CKMBINDEX, TROPONINI,  in the last 168 hours BNP: Platts results found for this basename: PROBNP,  in the last 168 hours D-Dimer: Thurow results found for this basename: DDIMER,  in the last 168 hours CBG: Quale results found for this basename: GLUCAP,  in  the last 168 hours Hemoglobin A1C: Leonetti results found for this basename: HGBA1C,  in the last 168 hours Fasting Lipid Panel: Barcomb results found for this basename: CHOL, HDL, LDLCALC, TRIG, CHOLHDL, LDLDIRECT,  in the last 168 hours Thyroid Function Tests: Legacy results found for this basename: TSH, T4TOTAL, FREET4, T3FREE, THYROIDAB,  in the last 168 hours Coagulation: Brosious results found for this basename: LABPROT, INR,  in the last 168 hours Anemia Panel:  Recent Labs Lab 09/26/13 1058  FERRITIN 938*  TIBC 190*  IRON 103   Urine Drug Screen: Drugs of Abuse  [place] results found for this basename: labopia, cocainscrnur, labbenz, amphetmu, thcu, labbarb    Alcohol Level: Yorke results found for this basename: ETH,  in the last 168 hours Urinalysis: Jaco results found for this basename: COLORURINE, APPERANCEUR, LABSPEC, PHURINE, GLUCOSEU, HGBUR, BILIRUBINUR, KETONESUR, PROTEINUR, UROBILINOGEN, NITRITE, LEUKOCYTESUR,  in the last 168 hours  Micro Results: Recent Results (from the  past 240 hour(s))  SURGICAL PCR SCREEN     Status: None   Collection Time    10/01/13  7:05 AM      Result Value Ref Range Status   MRSA, PCR NEGATIVE  NEGATIVE Final   Staphylococcus aureus NEGATIVE  NEGATIVE Final   Comment:            The Xpert SA Assay (FDA     approved for NASAL specimens     in patients over 37 years of age),     is one component of     a comprehensive surveillance     program.  Test performance has     been validated by [employer] for patients greater     than or equal to 38 year old.     It is not intended     to diagnose infection nor to     guide or monitor treatment.   Studies/Results: Leopard results found. Medications: I have reviewed the patient's current medications. Scheduled Meds: . [MAR HOLD] amLODipine  10 mg Oral Daily  . [MAR HOLD] darbepoetin (ARANESP) injection - DIALYSIS  60 mcg Intravenous Q Fri-HD  . cefUROXime (ZINACEF) 1.5 GM IVPB      . [facility] HOLD] isoniazid   300 mg Oral Daily  . [facility] HOLD] lanthanum  500 mg Oral TID WC  . [MAR HOLD] lisinopril  10 mg Oral Daily  . [MAR HOLD] vitamin B-6  50 mg Oral Daily  . [facility] HOLD] sodium chloride  3 mL Intravenous Q12H   Continuous Infusions: . sodium chloride 35 mL/hr (10/01/13 0920)   PRN Meds:.0.9 % irrigation (POUR BTL), [MAR HOLD] acetaminophen, [MAR HOLD] acetaminophen, fentaNYL, heparin 6000 unit irrigation, heparin, [MAR HOLD] hydrALAZINE, lidocaine-EPINEPHrine, metoCLOPramide, oxyCODONE, oxyCODONE Assessment/Plan: Mr. Keith Vaughan is a 48yo man, refugee from [place], [place] speaker, with PMH of ESRD on HD 3 times weekly and HTN presenting to establish care in [place].   1. ESRD on HD - The patient is a refugee from [place], with last HD July 9th prior to his arrival to the [place]. Per medical records from [place], his first HD was in December 2014 at [facility] likely due to uncontrolled hypertension. His vascular access has been Right IJ which was placed in December 2014. Last HD screening labs in 6/30 drawn at [facility] with HIV 1/2 non reactive, hep B antigen nonreactive, hep B antibody >1000.0 mIU/ml, anti-HCV non reactive (all these repeat labs have been negative). CXR from March 5th 2015 with "right IJ cath in-situ with borderline cardiomegaly". CXR here shows cardiomegaly, with Hynson focal region of consolidation nor focal infiltrates, interstitial findings consistent with pulmonary edema.  - Nephrology consulted, appreciate help with inpatient HD and setting up outpatient HD  - Renal diet--pt prefers rice and vegetables and Kerby beef or pork products  - Vascular Surgery consulted for AVF placement (vein mapping completed 09/27/13). Patient getting left radial-cephalic AV fistula today. - Hep A and B vaccines given on 09/27/13  - PPD positive, 15x29mm, red, raised. Patient asymptomatic. Quantiferon positive. 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  Discussed with Dr. Tommy Medal from ID and he recommended Isoniazid for 9 months. There is Mccartney need for negative pressure or respiratory isolation at this time.  2. HTN - BP as high as 200/116 on admission before HD but is now normal. His usual antihypertensive regimen include amlodipine 10mg  daily and enalapril 10mg   daily.  - Continue Amlodipine 10mg  PO daily and Lisinopril 10mg  PO daily  - Hydralazine 5mg  PRN for SPB >180   3. Anemia of chronic disease - Last Hg on 7/6 at 6.6>>8.9>>9.5 on 7/15 , per [place] medical report it appears that he had been receiving Erythropoietin 6000 U once per week since Jun 22 and received 2 units of pRBC during his last HD session on July 9th. Iron panel shows Fe 103, TIBC 190, Ferritin 938 which is consistent with anemia of chronic disease as expected.  - Continue to monitor   4. Thrombocytopenia - Chronic issue for him. Platelets of 91>>101>>124 on 10/01/13. Orlov bleeding noted.  - Continue to monitor   5. Refugee status - Simms signs of active TB. PPD placed, results7/12. IOM contact is Leanora Ivanoff [phone]. Dr. Hayes Ludwig spoke with her and she states that the patient already has Medicaid application in process as well as housing, food, and other community help. She will be in contact with the [facility] social worker and case managers. Prior to his departure from [place], the patient has been deemed independent of his ADLs and will likely not need ALF or SNF placement but we will await formal PT/OT evaluation prior to his housing arrangement is finalized.  - Social work/Care management consulted for placement/housing assistance. Need to follow up with Leanora Ivanoff [phone] to determine status of housing placement. - PT/OT consulted --> pt able to complete all ADLs independently   Diet: Renal diet, [place] beef or pork products  DVT prophylaxis: SCDs  Dispo: Disposition is deferred at this time, awaiting housing placement. Anticipated discharge in approximately 1-2 day(s).    The patient does not have a current PCP ([facility] Per Patient) and does need an [facility] hospital follow-up appointment after discharge.  The patient does not have transportation limitations that hinder transportation to clinic appointments.  .Services Needed at time of discharge: Y = Yes, Blank = Presti PT:   OT:   RN:   Equipment:   Other:     LOS: 5 days   [name], MD 10/01/2013, 11:51 AM

## 2013-10-01 NOTE — Progress Notes (Signed)
Pt gone down via bed for left arm fistula

## 2013-10-01 NOTE — Anesthesia Postprocedure Evaluation (Signed)
Anesthesia Post Note  Patient: Keith Vaughan  Procedure(s) Performed: Procedure(s) (LRB): RADIOCEPHALIC ARTERIOVENOUS (AV) FISTULA CREATION LEFT ARM (Left) INSERTION OF DIALYSIS CATHETER- Right Internal Jugular (Right)  Anesthesia type: General  Patient location: PACU  Post pain: Pain level controlled  Post assessment: Patient's Cardiovascular Status Stable  Last Vitals:  Filed Vitals:   10/01/13 1230  BP: 125/72  Pulse: 47  Temp: 36.4 C  Resp: 18    Post vital signs: Reviewed and stable  Level of consciousness: alert  Complications: Iovino apparent anesthesia complications

## 2013-10-01 NOTE — Anesthesia Preprocedure Evaluation (Signed)
Anesthesia Evaluation  Patient identified by MRN, date of birth, ID band Patient awake    Reviewed: Allergy & Precautions, H&P , NPO status , Patient's Chart, lab work & pertinent test results, reviewed documented beta blocker date and time   Airway Mallampati: II TM Distance: >3 FB Neck ROM: full    Dental   Pulmonary former smoker,  breath sounds clear to auscultation        Cardiovascular hypertension, On Medications Rhythm:regular     Neuro/Psych negative neurological ROS  negative psych ROS   GI/Hepatic negative GI ROS, Neg liver ROS,   Endo/Other  negative endocrine ROS  Renal/GU Dialysis and ESRFRenal disease  negative genitourinary   Musculoskeletal   Abdominal   Peds  Hematology negative hematology ROS (+) anemia ,   Anesthesia Other Findings See surgeon's H&P   Reproductive/Obstetrics negative OB ROS                           Anesthesia Physical Anesthesia Plan  ASA: III  Anesthesia Plan: General   Post-op Pain Management:    Induction: Intravenous  Airway Management Planned: LMA  Additional Equipment:   Intra-op Plan:   Post-operative Plan:   Informed Consent: I have reviewed the patients History and Physical, chart, labs and discussed the procedure including the risks, benefits and alternatives for the proposed anesthesia with the patient or authorized representative who has indicated his/her understanding and acceptance.   Dental Advisory Given  Plan Discussed with: CRNA and Surgeon  Anesthesia Plan Comments:         Anesthesia Quick Evaluation

## 2013-10-01 NOTE — Progress Notes (Signed)
Attempted interpreter communication over the phone.  Pt. Too sleepy to respond.

## 2013-10-01 NOTE — Telephone Encounter (Signed)
Message copied by Gena Fray on Wed Oct 01, 2013  3:14 PM ------      Message from: Peter Minium K      Created: Wed Oct 01, 2013  2:04 PM      Regarding: Schedule                   ----- Message -----         From: Gabriel Earing, PA-C         Sent: 10/01/2013  11:20 AM           To: Vvs Charge Pool            S/p left radiocephalic AVF and diatek cath placement 10/01/13.  F/u with Dr. Kellie Simmering in 6 weeks with duplex.            Thanks,      Aldona Bar ------

## 2013-10-01 NOTE — Progress Notes (Signed)
Pt back from sx left arm fistula and new HD cath placement to RIJ, removed temporary cath.

## 2013-10-01 NOTE — Interval H&P Note (Signed)
History and Physical Interval Note:  10/01/2013 10:07 AM  Keith Vaughan  has presented today for surgery, with the diagnosis of End stage renal disease   The various methods of treatment have been discussed with the patient and family. After consideration of risks, benefits and other options for treatment, the patient has consented to  Procedure(s): RADIOCEPHALIC ARTERIOVENOUS (AV) FISTULA CREATION (Left) INSERTION OF DIALYSIS CATHETER (N/A) as a surgical intervention .  The patient's history has been reviewed, patient examined, Nappi change in status, stable for surgery.  I have reviewed the patient's chart and labs.  Questions were answered to the patient's satisfaction.     [name]

## 2013-10-01 NOTE — Progress Notes (Signed)
Patient ID: Keith Vaughan, male   DOB: 1965-07-23, 48 y.o.   MRN: [id number]  [facility] Progress Note   Assessment/ Plan:   1. End-stage renal disease: On MWF schedule.  S/p R TDC and L RC AVF 10/01/13. Placement issues are ongoing 2. Hyperkalemia: Corrected with hemodialysis, continue renal diet.  3. Hypertension: Stable 4. Anemia of chronic kidney disease: Iron stores appear adequate, on ESA  5. Thrombocytopenia: Stable 6. Metabolic bone disease: Cont lanthanum for now; Ca high  Subjective:   L FA AVF today.  R IJ TDC today [place]broom new issues To start [place] for latent TB   Objective:   BP 125/72  Pulse 47  Temp(Src) 97.5 F (36.4 C) (Oral)  Resp 18  Wt 51.5 kg (113 lb 8.6 oz)  SpO2 100%  Physical Exam: Gen: Comfortably sitting up in bed CVS: Pulse regular in rate and rhythm, S1 and S2 normal. Right IJ temporary dialysis catheter Resp: Good auscultation bilaterally, Holik rales/rhonchi Abd: Soft, flat, nontender Ext: Rounsaville lower extremity edema  Labs: BMET  Recent Labs Lab 09/26/13 0504 09/26/13 1530 09/27/13 0436 09/30/13 0917 10/01/13 0345  NA 140 142 142 138 137  K 5.6* 5.0 4.6 4.2 4.0  CL 101 101 100 94* 94*  CO2 21 20 26 24 26   GLUCOSE 81 69* 69* 88 90  BUN 57* 62* 18 61* 26*  CREATININE 11.10* 12.04* 5.57* 9.84* 6.02*  CALCIUM 10.3 9.9 9.3 10.1 10.5  PHOS  --  5.7*  --  5.3*  --    CBC  Recent Labs Lab 09/26/13 1530 09/27/13 0436 09/30/13 0917 10/01/13 0345  WBC 6.0 5.6 6.3 7.0  HGB 8.3* 8.9* 9.1* 9.5*  HCT 25.3* 26.8* 27.3* 29.1*  MCV 86.6 86.5 84.8 85.8  PLT 94* 101* 146* 124*    Medications:    . amLODipine  10 mg Oral Daily  . darbepoetin (ARANESP) injection - DIALYSIS  60 mcg Intravenous Q Fri-HD  . cefUROXime (ZINACEF) 1.5 GM IVPB      . isoniazid  300 mg Oral Daily  . lanthanum  500 mg Oral TID WC  . lisinopril  10 mg Oral Daily  . vitamin B-6  50 mg Oral Daily  . sodium chloride  3 mL Intravenous Q12H   Pearson Grippe,  MD 10/01/2013, 4:33 PM

## 2013-10-01 NOTE — Progress Notes (Signed)
Pt still down for L arm fistula placememt

## 2013-10-01 NOTE — Telephone Encounter (Signed)
Patients is still admitted, appointment information will be given at time of discharge. Interp req for appt, dpm

## 2013-10-01 NOTE — Transfer of Care (Signed)
Immediate Anesthesia Transfer of Care Note  Patient: Keith Vaughan  Procedure(s) Performed: Procedure(s): RADIOCEPHALIC ARTERIOVENOUS (AV) FISTULA CREATION LEFT ARM (Left) INSERTION OF DIALYSIS CATHETER- Right Internal Jugular (Right)  Patient Location: PACU  Anesthesia Type:General  Level of Consciousness: awake  Airway & Oxygen Therapy: Patient Spontanous Breathing and Patient connected to nasal cannula oxygen  Post-op Assessment: Report given to PACU RN and Post -op Vital signs reviewed and stable  Post vital signs: Reviewed and stable  Complications: Cangemi apparent anesthesia complications

## 2013-10-01 NOTE — H&P (View-Only) (Signed)
Patient ID: Keith Vaughan, male   DOB: 1965/09/30, 48 y.o.   MRN: [id number] Vascular Surgery Progress Note  Subjective: Patient with end-stage renal disease needs vascular access. Rester interpreter available.  Objective:  Filed Vitals:   09/28/13 0454  BP: 131/91  Pulse: 65  Temp: 97.5 F (36.4 C)  Resp: 16    General alert and oriented x3-cannot communicate because of language barrier Left upper extremity appears to have adequate cephalic vein in the forearm for AV fistula   Labs:  Recent Labs Lab 09/26/13 0504 09/26/13 1530 09/27/13 0436  CREATININE 11.10* 12.04* 5.57*    Recent Labs Lab 09/26/13 0504 09/26/13 1530 09/27/13 0436  NA 140 142 142  K 5.6* 5.0 4.6  CL 101 101 100  CO2 21 20 26   BUN 57* 62* 18  CREATININE 11.10* 12.04* 5.57*  GLUCOSE 81 69* 69*  CALCIUM 10.3 9.9 9.3    Recent Labs Lab 09/26/13 0504 09/26/13 1530 09/27/13 0436  WBC 6.2 6.0 5.6  HGB 8.8* 8.3* 8.9*  HCT 26.6* 25.3* 26.8*  PLT 91* 94* 101*   Mansel results found for this basename: INR,  in the last 168 hours  I/O last 3 completed shifts: In: 840 [P.O.:840] Out: 2001 [Urine:1; Other:2000]  Imaging: Dg Chest 2 View  09/26/2013   CLINICAL DATA:  Eval for HD cath  EXAM: CHEST  2 VIEW  COMPARISON:  None.  FINDINGS: Low lung volumes. Cardiac silhouette is enlarged. A right internal jugular sheath is appreciated tip superior vena cava. Huelsmann pneumothorax. There is diffuse prominence of interstitial markings and central peribronchial cuffing. Sanderford focal region of consolidation nor focal infiltrates. Lukes acute osseous abnormalities.  IMPRESSION: Interstitial findings consistent with pulmonary edema.  Arciniega focal regions of consolidation.  Shipman pneumothorax.  Cardiomegaly.   Electronically Signed   By: [name] M.D.   On: 09/26/2013 11:31   US Abdomen Complete  09/26/2013   CLINICAL DATA:  Eval RUQ, history of end-stage renal disease receiving dialysis  EXAM: ULTRASOUND ABDOMEN COMPLETE  COMPARISON:   None.  FINDINGS: Gallbladder:  Cui gallstones. Gallbladder wall is slightly prominent measuring 3.3 mm in thickness. Doxtater pericholecystic fluid nor a sonographic Murphy sign.  Common bile duct:  Diameter: 4.4 mm  Liver:  Fairley focal lesion identified. Within normal limits in parenchymal echogenicity.  IVC:  Hutsell abnormality visualized.  Pancreas:  Visualized portion unremarkable.  Spleen:  Size and appearance within normal limits.  Right Kidney:  Length: 7.6 cm. Decreased corticomedullary differentiation and diffuse increased cortical echogenicity. Estala hydronephrosis. Whitwell gross evidence of solid masses.  Left Kidney:  Length: 6.26 cm. Decreased corticomedullary differentiation, diffuse increased cortical echogenicity and Meenach evidence of hydronephrosis. Stegmaier gross evidence of solid masses.  Abdominal aorta:  Gargis aneurysm visualized.  Other findings:  None.  IMPRESSION: Gallbladder wall prominence without further sonographic evidence of cholecystitis. Correlation with biliary function tests recommended if clinically warranted.  Bilateral renal atrophy with sonographic findings consistent with patient's history of end-stage renal disease.   Electronically Signed   By: [name] M.D.   On: 09/26/2013 12:13    Assessment/Plan:    LOS: 2 days  s/p   Plan left radial-cephalic AV fistula and exchange of hemodialysis catheter right neck possibly on Wednesday Will need interpreter in order to obtain consent   [name], MD 09/28/2013 8:50 AM

## 2013-10-01 NOTE — Discharge Instructions (Signed)
° ° °  10/01/2013 Keith Vaughan [id number] 08/01/1965  Surgeon(s): Mal Misty, MD  Procedure(s): RADIOCEPHALIC ARTERIOVENOUS (AV) FISTULA CREATION LEFT ARM INSERTION OF DIALYSIS CATHETER  x Do not stick fistula for 12 weeks

## 2013-10-01 NOTE — Op Note (Signed)
OPERATIVE REPORT  Date of Surgery: 09/26/2013 - 10/01/2013  Surgeon: [name], MD  Assistant: Dionicio Stall  Pre-op Diagnosis: End stage renal disease   Post-op Diagnosis: End stage renal disease   Procedure: Procedure(s): RADIOCEPHALIC ARTERIOVENOUS (AV) FISTULA CREATION LEFT ARM INSERTION OF DIALYSIS CATHETER- Right Internal Jugular  Anesthesia: LMA EBL: Minimal  Complications: None  Procedure Details: The patient was taken to the operating room placed in supine position at which time satisfactory Gen.-LMA anesthesia was measured. Left upper extremity was prepped Betadine scrub and solution draped in routine sterile manner. A short longitudinal incision was made just proximal to the wrist between the radial artery and cephalic vein. Cephalic vein was dissected free branches ligated with 3 and 4-0 silk ties and divided it was gently dilated with saline after dividing it distally. An excellent vein about 3 mm in size. Radial artery exposed and the fascia encircled with Vesseloops. A 3 mm artery with an excellent pulse. Credeur heparin was given. Artery was occluded proximally and distally with vessel and a 15 blade extended with Potts scissors. Would easily accept a 3 mm dilator. The vein was carefully spatulated and anastomosed end-to-side with 6-0 Prolene. Vesseloops were then released there was excellent pulse and palpable thrill up to the antecubital area. Hemostasis was achieved and closed in layers with Vicryl subcuticular fashion and Dermabond. Attention then turned to the right neck weren't there was an existing temporary dialysis catheter in the right internal digger vein which had been placed at another facility. This had been inserted through the right IJ. After prepping and draping the entire catheter into the field catheter was transected a guidewire passed through the central port in the right atrium under fluoroscopic guidance. The WAS removed over the guidewire. Tract was  slightly dilated and then a 19 cm cuffed hemodialysis catheter was passed through peel-away sheath positioned in the right atrium and tunneled peripherally and secured with nylon sutures. Wounds and closed with Vicryl subarticular fashion. The position was confirmed fluoroscopically patient taken to recovery in stable condition   [name], MD 10/01/2013 11:47 AM

## 2013-10-01 NOTE — Progress Notes (Signed)
Paged Dr. Arcelia Jew for diet order.

## 2013-10-02 MED ORDER — HEPARIN SODIUM (PORCINE) 5000 UNIT/ML IJ SOLN
5000.0000 [IU] | Freq: Three times a day (TID) | INTRAMUSCULAR | Status: DC
  Administered 2013-10-02 – 2013-10-09 (×21): 5000 [IU] via SUBCUTANEOUS
  Filled 2013-10-02 (×22): qty 1

## 2013-10-02 MED ORDER — ACETAMINOPHEN 325 MG PO TABS
ORAL_TABLET | ORAL | Status: AC
  Filled 2013-10-02: qty 2

## 2013-10-02 NOTE — Plan of Care (Signed)
Problem: Phase II Progression Outcomes Goal: Discharge plan established Outcome: Progressing Social worker and case management consulted.

## 2013-10-02 NOTE — Procedures (Signed)
Patient was seen on dialysis and the procedure was supervised.  BFR 400  Via PC BP is  148/85.   Patient appears to be tolerating treatment well  Keith Vaughan A 10/02/2013

## 2013-10-02 NOTE — Progress Notes (Signed)
  [facility] Postoperative hemodialysis access   Date of Surgery:  10/01/13 Surgeon: [name], MD  Subjective:  Spoke with patient via interpreter. Complaining of sore throat. And pain around diatek catheter.   PHYSICAL EXAMINATION:  Filed Vitals:   10/02/13 0435  BP: 126/75  Pulse: 63  Temp: 98.6 F (37 C)  Resp: 17    Incision is c/d/i Hand grip is 5/5 Sensation in digits is intact. There is a thrill  There is a bruit. The graft/fistula is palpable   Right neck TDC: Mole hematoma, drainage or erythema.    ASSESSMENT/PLAN:  Keith Vaughan is a 48 y.o. year old male who is s/p left radiocephalic arteriovenous creation and insertion of right IJ tunneled dialysis catheter placement POD 1.  -graft is patent -Venturino evidence of steal sx -f/u with Dr. Kellie Simmering in 4-6 weeks to check maturation of AVF -will sign off-call as needed.   Virgina Jock, PA-C [facility] [place] Pager: [phone]

## 2013-10-02 NOTE — Care Management Note (Signed)
Meeting yesterday ,10/01/2013 with rep from [facility] , explained that this pt will be ready for d/c in next 24-48 hr, when stable from surgery. Also asked hemodialysis rep to meet with this agency to explain the critical need for an address for this pt so that the "CLIP" process to setup outpatient dialysis could begin. Per the agency rep, the Medicaid application would be completed on 10/01/2013 and they will work on a place for this pt to live. This CM explained in detail the that Mr Oates does not need to stay in the hospital, that until his outpatient hemodialysis is arranged he will have to return to the hospital and be treated as needed. Will follow up with the immigration services today.  Jasmine Pang RN MPH, case manager, [phone]

## 2013-10-02 NOTE — Progress Notes (Signed)
Patient ID: Keith Vaughan, male   DOB: 07-14-1965, 48 y.o.   MRN: [id number] Attending physician note: I personally examined this patient today together with resident physician Dr. [name] and concur with her assessment and management plan. He is stable 24 hours post placement of a AV fistula on his left wrist. He has a temporary dialysis catheter in the right subclavian position. Ongoing interactions with our care management team nearly complete and we should be able to get him into a suitable living situation within the next 24 hours.

## 2013-10-02 NOTE — Care Management Note (Addendum)
10/02/2013 Met with pt , pt friends, and Lola, rep from the [facility] ([place]). Using an interpeter pt voiced that he wishes to live alone if possible. This will be arranged by [facility] for tomorrow 10/03/2013 , hemodialysis  Informed and will begin CLIP process now that an address has been established. Pt attending is aware of plans and states that pt is medically ready for d/c. This CM met with Dr. Joelyn Oms and discussed possible d/c of this pt , given medical stability , place to live, and Medicaid pending. Dr Joelyn Oms will discuss with his practice associates and determine if d/c is appropriate or if they feel that an outpatient hemodialysis seat must be established prior to d/c.   CRoyal RN MPH, [phone]

## 2013-10-02 NOTE — Progress Notes (Signed)
Subjective: Patient feeling well today. He denies any pain with new AV fistula on left forearm. He does complain of sore throat and nasal pain due to anesthesia yesterday for surgery. He is eating well.  Objective: Vital signs in last 24 hours: Filed Vitals:   10/01/13 1700 10/01/13 2021 10/02/13 0435 10/02/13 0945  BP: 166/97 161/83 126/75 135/78  Pulse: 61 72 63 64  Temp: 98.6 F (37 C) 98.5 F (36.9 C) 98.6 F (37 C) 98.5 F (36.9 C)  TempSrc: Oral   Oral  Resp: 21 17 17 18   Weight:      SpO2: 95% 94% 93% 95%   Weight change:   Intake/Output Summary (Last 24 hours) at 10/02/13 1328 Last data filed at 10/02/13 0947  Gross per 24 hour  Intake    480 ml  Output      0 ml  Net    480 ml   Physical Exam: General: alert, cooperative, converses in Burmese, sitting up in bed  Neck: Supple, Rego JVD. Right IJ catheter in place, covered by clean bandage  CV: RRR, normal S1/S2, Kirlin m/g/r  Pulm: CTA bilaterally, Sangalang wheezes, Herrington cough  Ext: warm, moves all, Roppolo edema. Small lipoma located on outside of left ankle, Burek tenderness. Left radiocephalic fistula on forearm, thrill present.  Msk: normal ROM, Niebuhr edema  Neuro: alert, oriented to person, place, and time. Haney CN deficits. Strength intact. Skin: Tattoos on left forearm and both arms.   Lab Results: Basic Metabolic Panel:  Recent Labs Lab 09/26/13 1530  09/30/13 0917 10/01/13 0345  NA 142  < > 138 137  K 5.0  < > 4.2 4.0  CL 101  < > 94* 94*  CO2 20  < > 24 26  GLUCOSE 69*  < > 88 90  BUN 62*  < > 61* 26*  CREATININE 12.04*  < > 9.84* 6.02*  CALCIUM 9.9  < > 10.1 10.5  PHOS 5.7*  --  5.3*  --   < > = values in this interval not displayed. Liver Function Tests:  Recent Labs Lab 09/26/13 0504 09/26/13 1530 09/30/13 0917  AST 8  --   --   ALT 9  --   --   ALKPHOS 53  --   --   BILITOT 0.4  --   --   PROT 6.6  --   --   ALBUMIN 3.5 3.2* 3.6   Robles results found for this basename: LIPASE, AMYLASE,  in the last  168 hours Kinnard results found for this basename: AMMONIA,  in the last 168 hours CBC:  Recent Labs Lab 09/30/13 0917 10/01/13 0345  WBC 6.3 7.0  HGB 9.1* 9.5*  HCT 27.3* 29.1*  MCV 84.8 85.8  PLT 146* 124*   Cardiac Enzymes: Onken results found for this basename: CKTOTAL, CKMB, CKMBINDEX, TROPONINI,  in the last 168 hours BNP: Mierzejewski results found for this basename: PROBNP,  in the last 168 hours D-Dimer: Matura results found for this basename: DDIMER,  in the last 168 hours CBG: Kovacik results found for this basename: GLUCAP,  in the last 168 hours Hemoglobin A1C: Panchal results found for this basename: HGBA1C,  in the last 168 hours Fasting Lipid Panel: Meuser results found for this basename: CHOL, HDL, LDLCALC, TRIG, CHOLHDL, LDLDIRECT,  in the last 168 hours Thyroid Function Tests: Isa results found for this basename: TSH, T4TOTAL, FREET4, T3FREE, THYROIDAB,  in the last 168 hours Coagulation: Sigmon results found for  this basename: LABPROT, INR,  in the last 168 hours Anemia Panel:  Recent Labs Lab 09/26/13 1058  FERRITIN 938*  TIBC 190*  IRON 103   Urine Drug Screen: Drugs of Abuse  Perezperez results found for this basename: labopia, cocainscrnur, labbenz, amphetmu, thcu, labbarb    Alcohol Level: Achey results found for this basename: ETH,  in the last 168 hours Urinalysis: Wingate results found for this basename: COLORURINE, APPERANCEUR, LABSPEC, PHURINE, GLUCOSEU, HGBUR, BILIRUBINUR, KETONESUR, PROTEINUR, UROBILINOGEN, NITRITE, LEUKOCYTESUR,  in the last 168 hours   Micro Results: Recent Results (from the past 240 hour(s))  SURGICAL PCR SCREEN     Status: None   Collection Time    10/01/13  7:05 AM      Result Value Ref Range Status   MRSA, PCR NEGATIVE  NEGATIVE Final   Staphylococcus aureus NEGATIVE  NEGATIVE Final   Comment:            The Xpert SA Assay (FDA     approved for NASAL specimens     in patients over 32 years of age),     is one component of     a comprehensive surveillance      program.  Test performance has     been validated by [employer] for patients greater     than or equal to 1 year old.     It is not intended     to diagnose infection nor to     guide or monitor treatment.   Studies/Results: Dg Chest Port 1 View  10/01/2013   CLINICAL DATA:  Diabetic catheter insertion.  EXAM: PORTABLE CHEST - 1 VIEW  COMPARISON:  09/26/2013  FINDINGS: Double lumen catheter has been inserted and the tips appear in good position in the superior vena cava and cavoatrial junction. Orban pneumothorax. Pulmonary vascularity is normal and the lungs are clear. Lenhoff osseous abnormality. Slight cardiomegaly.  IMPRESSION: Diatek catheter appears in good position.  Albee pneumothorax.   Electronically Signed   By: Rozetta Nunnery M.D.   On: 10/01/2013 12:17   Dg Fluoro Guide Cv Line-Sirek Report  10/01/2013   CLINICAL DATA: dialysis catheter placement   FLOURO GUIDE CV LINE  Fluoroscopy was utilized by the requesting physician.  Ontko radiographic  interpretation.    Medications: I have reviewed the patient's current medications. Scheduled Meds: . amLODipine  10 mg Oral Daily  . darbepoetin (ARANESP) injection - DIALYSIS  60 mcg Intravenous Q Fri-HD  . heparin subcutaneous  5,000 Units Subcutaneous 3 times per day  . isoniazid  300 mg Oral Daily  . lanthanum  500 mg Oral TID WC  . lisinopril  10 mg Oral Daily  . vitamin B-6  50 mg Oral Daily  . sodium chloride  3 mL Intravenous Q12H   Continuous Infusions: . sodium chloride 35 mL/hr (10/01/13 0920)   PRN Meds:.acetaminophen, acetaminophen, hydrALAZINE, oxyCODONE Assessment/Plan: Mr. Vines is a 48yo man, refugee from [place], [place] speaker, with PMH of ESRD on HD 3 times weekly and HTN presenting to establish care in [place].   1. ESRD on HD - The patient is a refugee from [place], with last HD July 9th prior to his arrival to the [place]. Per medical records from [place], his first HD was in December 2014 at [facility] likely  due to uncontrolled hypertension. His vascular access has been Right IJ which was placed in December 2014. Last HD screening labs in 6/30 drawn at [facility]  at [facility] with HIV 1/2 non reactive, hep B antigen nonreactive, hep B antibody >1000.0 mIU/ml, anti-HCV non reactive (all these repeat labs have been negative). CXR from March 5th 2015 with "right IJ cath in-situ with borderline cardiomegaly". CXR here shows cardiomegaly, with Hulon focal region of consolidation nor focal infiltrates, interstitial findings consistent with pulmonary edema.  - Nephrology consulted, appreciate help with inpatient HD and setting up outpatient HD  - Renal diet--pt prefers rice and vegetables and Lord beef or pork products  - Vascular Surgery consulted for AVF placement (vein mapping completed 09/27/13). Patient underwent left radial-cephalic AV fistula placement yesterday. Pt to follow up in 4-6 weeks with Dr. Kellie Simmering. - Hep A and B vaccines given on 09/27/13  - PPD positive, 15x66mm, red, raised. Patient asymptomatic. Quantiferon positive. 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 Discussed with Dr. Tommy Medal from ID and he recommended Isoniazid for 9 months. There is Wos need for negative pressure or respiratory isolation at this time.   2. HTN - BP as high as 200/116 on admission before HD but is now normal. His usual antihypertensive regimen include amlodipine 10mg  daily and enalapril 10mg  daily.  - Continue Amlodipine 10mg  PO daily and Lisinopril 10mg  PO daily  - Hydralazine 5mg  PRN for SPB >180   3. Anemia of chronic disease - Last Hg on 7/6 at 6.6>>8.9>>9.5 on 7/15 , per [place] medical report it appears that he had been receiving Erythropoietin 6000 U once per week since Jun 22 and received 2 units of pRBC during his last HD session on July 9th. Iron panel shows Fe 103, TIBC 190, Ferritin 938 which is consistent with anemia of chronic disease as expected.  - Continue to monitor    4. Thrombocytopenia - Chronic issue for him. Platelets of 91>>101>>124 on 10/01/13. Haubner bleeding noted.  - Continue to monitor   5. Refugee status - Klima signs of active TB. PPD+, neg CXR, +quantiferon, started on Isoniazid. IOM contact is Leanora Ivanoff [phone]. Dr. Hayes Ludwig spoke with her and she states that the patient already has Medicaid application in process as well as housing, food, and other community help. She will be in contact with the [facility] social worker and case managers. Prior to his departure from [place], the patient has been deemed independent of his ADLs and will likely not need ALF or SNF placement. This was confirmed with PT/OT, patient can complete all ADLs independently. - Social work/Care management consulted for placement/housing assistance. Need to follow up with Leanora Ivanoff [phone] to determine status of housing placement--> Talked with patient, Lanae Boast ([employer] rep) and case manager, Malachy Mood, this AM and CLIP process has begun now that address established. Nephrology (Dr. Joelyn Oms) will be setting up outpatient HD. - Patient will need PCP follow up. I would be happy to see patient in outpatient IM clinic. Will schedule appointment for him before discharge tomorrow.   Diet: Renal diet, Haston beef or pork products  DVT prophylaxis: SCDs  Dispo: Anticipated discharge tomorrow 7/17.   The patient does not have a current PCP ([facility] Pcp Per Patient) and does need an [facility] hospital follow-up appointment after discharge.  The patient does not have transportation limitations that hinder transportation to clinic appointments.  .Services Needed at time of discharge: Y = Yes, Blank = Frangos PT:   OT:   RN:   Equipment:   Other:     LOS: 6 days   [name], MD  10/02/2013, 1:28 PM

## 2013-10-03 ENCOUNTER — Encounter (HOSPITAL_COMMUNITY): Payer: Self-pay | Admitting: Vascular Surgery

## 2013-10-03 DIAGNOSIS — D649 Anemia, unspecified: Secondary | ICD-10-CM

## 2013-10-03 MED ORDER — DARBEPOETIN ALFA-POLYSORBATE 60 MCG/0.3ML IJ SOLN
60.0000 ug | INTRAMUSCULAR | Status: DC
  Filled 2013-10-03: qty 0.3

## 2013-10-03 NOTE — Progress Notes (Signed)
Subjective: Patient has Boldman complaints today. Patient to be discharged today with plans arranged by [place].   Objective: Vital signs in last 24 hours: Filed Vitals:   10/02/13 1732 10/02/13 1807 10/02/13 2137 10/03/13 0424  BP: 157/89 146/58 134/81 162/91  Pulse: 75 84 63 57  Temp: 98.3 F (36.8 C) 98 F (36.7 C) 97.6 F (36.4 C) 98.1 F (36.7 C)  TempSrc: Oral Oral Oral Oral  Resp: 16 16 17 18   Weight: 113 lb 1.5 oz (51.3 kg)  117 lb 15.1 oz (53.5 kg)   SpO2: 96% 97% 97% 97%   Weight change:   Intake/Output Summary (Last 24 hours) at 10/03/13 0913 Last data filed at 10/02/13 1810  Gross per 24 hour  Intake    480 ml  Output    400 ml  Net     80 ml   Physical Exam General: alert, cooperative, converses in Burmese, sitting up in bed  Neck: Supple, Arbaugh JVD. Right IJ catheter in place, covered by clean bandage  CV: RRR, normal S1/S2, Vetrano m/g/r  Pulm: CTA bilaterally, Hardacre wheezes, Molesworth cough  Ext: warm, moves all, Sohail edema. Small lipoma located on outside of left ankle, Riccardi tenderness. Left radiocephalic fistula on forearm, thrill present.  Msk: normal ROM, Kwiecinski edema  Neuro: alert, oriented to person, place, and time. Campisi CN deficits. Strength intact.  Skin: Tattoos on left forearm and both arms.   Lab Results: Basic Metabolic Panel:  Recent Labs Lab 09/26/13 1530  09/30/13 0917 10/01/13 0345  NA 142  < > 138 137  K 5.0  < > 4.2 4.0  CL 101  < > 94* 94*  CO2 20  < > 24 26  GLUCOSE 69*  < > 88 90  BUN 62*  < > 61* 26*  CREATININE 12.04*  < > 9.84* 6.02*  CALCIUM 9.9  < > 10.1 10.5  PHOS 5.7*  --  5.3*  --   < > = values in this interval not displayed. Liver Function Tests:  Recent Labs Lab 09/26/13 1530 09/30/13 0917  ALBUMIN 3.2* 3.6   Mcever results found for this basename: LIPASE, AMYLASE,  in the last 168 hours Meng results found for this basename: AMMONIA,  in the last 168 hours CBC:  Recent Labs Lab 09/30/13 0917 10/01/13 0345  WBC 6.3 7.0  HGB 9.1* 9.5*   HCT 27.3* 29.1*  MCV 84.8 85.8  PLT 146* 124*   Cardiac Enzymes: Dandridge results found for this basename: CKTOTAL, CKMB, CKMBINDEX, TROPONINI,  in the last 168 hours BNP: Gutt results found for this basename: PROBNP,  in the last 168 hours D-Dimer: Mazur results found for this basename: DDIMER,  in the last 168 hours CBG: Ogren results found for this basename: GLUCAP,  in the last 168 hours Hemoglobin A1C: Churchwell results found for this basename: HGBA1C,  in the last 168 hours Fasting Lipid Panel: Delmonaco results found for this basename: CHOL, HDL, LDLCALC, TRIG, CHOLHDL, LDLDIRECT,  in the last 168 hours Thyroid Function Tests: Roderick results found for this basename: TSH, T4TOTAL, FREET4, T3FREE, THYROIDAB,  in the last 168 hours Coagulation: Callaham results found for this basename: LABPROT, INR,  in the last 168 hours Anemia Panel:  Recent Labs Lab 09/26/13 1058  FERRITIN 938*  TIBC 190*  IRON 103   Urine Drug Screen: Drugs of Abuse  Matousek results found for this basename: labopia, cocainscrnur, labbenz, amphetmu, thcu, labbarb    Alcohol Level: Renzulli results found  for this basename: ETH,  in the last 168 hours Urinalysis: Stefano results found for this basename: COLORURINE, APPERANCEUR, LABSPEC, [place], GLUCOSEU, HGBUR, BILIRUBINUR, KETONESUR, PROTEINUR, UROBILINOGEN, NITRITE, LEUKOCYTESUR,  in the last 168 hours  Micro Results: Recent Results (from the past 240 hour(s))  SURGICAL PCR SCREEN     Status: None   Collection Time    10/01/13  7:05 AM      Result Value Ref Range Status   MRSA, PCR NEGATIVE  NEGATIVE Final   Staphylococcus aureus NEGATIVE  NEGATIVE Final   Comment:            The Xpert SA Assay (FDA     approved for NASAL specimens     in patients over 84 years of age),     is one component of     a comprehensive surveillance     program.  Test performance has     been validated by [employer] for patients greater     than or equal to 70 year old.     It is not intended     to  diagnose infection nor to     guide or monitor treatment.   Studies/Results: Dg Chest Port 1 View  10/01/2013   CLINICAL DATA:  Diabetic catheter insertion.  EXAM: PORTABLE CHEST - 1 VIEW  COMPARISON:  09/26/2013  FINDINGS: Double lumen catheter has been inserted and the tips appear in good position in the superior vena cava and cavoatrial junction. Netto pneumothorax. Pulmonary vascularity is normal and the lungs are clear. Schley osseous abnormality. Slight cardiomegaly.  IMPRESSION: Diatek catheter appears in good position.  Daughtrey pneumothorax.   Electronically Signed   By: Rozetta Nunnery M.D.   On: 10/01/2013 12:17   Dg Fluoro Guide Cv Line-Albro Report  10/01/2013   CLINICAL DATA: dialysis catheter placement   FLOURO GUIDE CV LINE  Fluoroscopy was utilized by the requesting physician.  Pasquini radiographic  interpretation.    Medications: I have reviewed the patient's current medications. Scheduled Meds: . amLODipine  10 mg Oral Daily  . darbepoetin (ARANESP) injection - DIALYSIS  60 mcg Intravenous Q Fri-HD  . heparin subcutaneous  5,000 Units Subcutaneous 3 times per day  . isoniazid  300 mg Oral Daily  . lanthanum  500 mg Oral TID WC  . lisinopril  10 mg Oral Daily  . vitamin B-6  50 mg Oral Daily  . sodium chloride  3 mL Intravenous Q12H   Continuous Infusions: . sodium chloride 35 mL/hr (10/01/13 0920)   PRN Meds:.acetaminophen, acetaminophen, hydrALAZINE, oxyCODONE Assessment/Plan: Mr. Brodhead is a 48yo man, refugee from [place], [place] speaker, with PMH of ESRD on HD 3 times weekly and HTN presenting to establish care in [place].   1. ESRD on HD - The patient is a refugee from [place], with last HD July 9th prior to his arrival to the [place]. Per medical records from [place], his first HD was in December 2014 at [facility] likely due to uncontrolled hypertension. His vascular access has been Right IJ which was placed in December 2014. Last HD screening labs in 6/30 drawn at [facility] with HIV 1/2 non reactive, hep B antigen nonreactive, hep B antibody >1000.0 mIU/ml, anti-HCV non reactive (all these repeat labs have been negative). CXR from March 5th 2015 with "right IJ cath in-situ with borderline cardiomegaly". CXR here shows cardiomegaly, with Weisner focal region of consolidation nor focal infiltrates, interstitial findings consistent  with pulmonary edema.  - Nephrology consulted, appreciate help with inpatient HD and setting up outpatient HD  - Renal diet--pt prefers rice and vegetables and Gastineau beef or pork products  - Vascular Surgery consulted for AVF placement (vein mapping completed 09/27/13). Patient underwent left radial-cephalic AV fistula placement on 10/02/13. Pt to follow up in 4-6 weeks with Dr. Kellie Simmering.  - Hep A and B vaccines given on 09/27/13  - PPD positive, 15x65mm, red, raised. Patient asymptomatic. Quantiferon positive. 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 Discussed with Dr. Tommy Medal from ID and he recommended Isoniazid for 9 months. There is Brosseau need for negative pressure or respiratory isolation at this time.  2. HTN - BP as high as 200/116 on admission before HD but is now normal. His usual antihypertensive regimen include amlodipine 10mg  daily and enalapril 10mg  daily.  - Continue Amlodipine 10mg  PO daily and Lisinopril 10mg  PO daily  - Hydralazine 5mg  PRN for SPB >180   3. Anemia of chronic disease - Last Hg on 7/6 at 6.6>>8.9>>9.5 on 7/15 , per [place] medical report it appears that he had been receiving Erythropoietin 6000 U once per week since Jun 22 and received 2 units of pRBC during his last HD session on July 9th. Iron panel shows Fe 103, TIBC 190, Ferritin 938 which is consistent with anemia of chronic disease as expected.  - Continue to monitor   4. Thrombocytopenia - Chronic issue for him. Platelets of 91>>101>>124 on 10/01/13. Soffer bleeding noted.  - Continue to monitor   5. Refugee status - Kingsley signs of  active TB. PPD+, neg CXR, +quantiferon, started on Isoniazid. IOM contact is Leanora Ivanoff [phone]. Dr. Hayes Ludwig spoke with her and she states that the patient already has Medicaid application in process as well as housing, food, and other community help. She will be in contact with the [facility] social worker and case managers. Prior to his departure from [place], the patient has been deemed independent of his ADLs and will likely not need ALF or SNF placement. This was confirmed with PT/OT, patient can complete all ADLs independently.  - Social work/Care management consulted for placement/housing assistance. Talked with patient, Lanae Boast ([place]) and case Freight forwarder, Malachy Mood, yesterday and CLIP process has begun now that address established. Nephrology (Dr. Joelyn Oms) will be setting up outpatient HD.  - Patient will need PCP follow up. I would be happy to see patient in outpatient IM clinic.   Diet: Renal diet, Aprea beef or pork products  DVT prophylaxis: SCDs  Dispo: Anticipated discharge today.     The patient does not have a current PCP ([facility] Pcp Per Patient) and does need an [facility] hospital follow-up appointment after discharge.  The patient does not have transportation limitations that hinder transportation to clinic appointments.  .Services Needed at time of discharge: Y = Yes, Blank = [place] PT:   OT:   RN:   Equipment:   Other:     LOS: 7 days   [name], MD 10/03/2013, 9:13 AM

## 2013-10-03 NOTE — Care Management Note (Signed)
Await Renal recommendations re possible d/c, as this pt has a place to live, Medicaid application has been completed and submitted , CLIP is in process, and pt is medically stable per attending group. Contact for [employer] is Lola @ [phone]. Jasmine Pang RN MPH, case manager, [phone]

## 2013-10-03 NOTE — Progress Notes (Signed)
Patient ID: Keith Vaughan, male   DOB: 1966/02/29, 48 y.o.   MRN: [id number]  [facility] Progress Note   Assessment/ Plan:   1. End-stage renal disease: Pt accepted to [facility] starting 7/23.  He must stay to rec HD through 7/21.  Next HD tomorrow.  Pt s/p L RC AVF and [facility] 10/01/13 w/ VVS 2. Hyperkalemia: Corrected with hemodialysis, continue renal diet.  3. Hypertension: Stable 4. Anemia of chronic kidney disease: Iron stores appear adequate, on ESA  5. Thrombocytopenia: Stable 6. Metabolic bone disease: Cont lanthanum for now; Ca high  Subjective:   Pt accepted to [facility] but first Tx not until 7/23.   Dor New Events   Objective:   BP 155/80  Pulse 69  Temp(Src) 98.4 F (36.9 C) (Oral)  Resp 18  Wt 53.5 kg (117 lb 15.1 oz)  SpO2 98%  Physical Exam: Gen: Comfortably sitting up in bed CVS: Pulse regular in rate and rhythm, S1 and S2 normal. Right IJ temporary dialysis catheter Resp: Good auscultation bilaterally, Powell rales/rhonchi Abd: Soft, flat, nontender Ext: Dirr lower extremity edema  Labs: BMET  Recent Labs Lab 09/26/13 1530 09/27/13 0436 09/30/13 0917 10/01/13 0345  NA 142 142 138 137  K 5.0 4.6 4.2 4.0  CL 101 100 94* 94*  CO2 20 26 24 26   GLUCOSE 69* 69* 88 90  BUN 62* 18 61* 26*  CREATININE 12.04* 5.57* 9.84* 6.02*  CALCIUM 9.9 9.3 10.1 10.5  PHOS 5.7*  --  5.3*  --    CBC  Recent Labs Lab 09/26/13 1530 09/27/13 0436 09/30/13 0917 10/01/13 0345  WBC 6.0 5.6 6.3 7.0  HGB 8.3* 8.9* 9.1* 9.5*  HCT 25.3* 26.8* 27.3* 29.1*  MCV 86.6 86.5 84.8 85.8  PLT 94* 101* 146* 124*    Medications:    . amLODipine  10 mg Oral Daily  . [START ON 10/04/2013] darbepoetin (ARANESP) injection - DIALYSIS  60 mcg Intravenous Q Sat-HD  . heparin subcutaneous  5,000 Units Subcutaneous 3 times per day  . isoniazid  300 mg Oral Daily  . lanthanum  500 mg Oral TID WC  . lisinopril  10 mg Oral Daily  . vitamin B-6  50 mg Oral Daily  . sodium chloride  3 mL  Intravenous Q12H   Pearson Grippe, MD 10/03/2013, 2:22 PM

## 2013-10-03 NOTE — Progress Notes (Signed)
Patient ID: Keith Vaughan, male   DOB: 1965/12/30, 48 y.o.   MRN: [id number] Attending physician note: Mcpartlin acute changes in patient condition. Right subclavian temporary dialysis catheter. Now 3 days postop placement of a vascular graft right forearm. Wound site looks clean and healing well. Lungs are clear. Management and discharge plans discussed with nephrology. We should be able to get the patient out to assisted living situation today to continue dialysis as an outpatient. We appreciate all the efforts from case management services.

## 2013-10-04 LAB — RENAL FUNCTION PANEL
Albumin: 3.6 g/dL (ref 3.5–5.2)
Anion gap: 21 — ABNORMAL HIGH (ref 5–15)
BUN: 58 mg/dL — ABNORMAL HIGH (ref 6–23)
CO2: 24 meq/L (ref 19–32)
Calcium: 10.5 mg/dL (ref 8.4–10.5)
Chloride: 92 mEq/L — ABNORMAL LOW (ref 96–112)
Creatinine, Ser: 9.85 mg/dL — ABNORMAL HIGH (ref 0.50–1.35)
GFR calc non Af Amer: 5 mL/min — ABNORMAL LOW (ref >90)
GFR, EST AFRICAN AMERICAN: 6 mL/min — AB (ref >90)
GLUCOSE: 95 mg/dL (ref 70–99)
Phosphorus: 7.2 mg/dL — ABNORMAL HIGH (ref 2.3–4.6)
Potassium: 5 mEq/L (ref 3.7–5.3)
SODIUM: 137 meq/L (ref 137–147)

## 2013-10-04 LAB — CBC
HCT: 26.7 % — ABNORMAL LOW (ref 39.0–52.0)
HEMOGLOBIN: 9 g/dL — AB (ref 13.0–17.0)
MCH: 29.5 pg (ref 26.0–34.0)
MCHC: 33.7 g/dL (ref 30.0–36.0)
MCV: 87.5 fL (ref 78.0–100.0)
Platelets: 129 10*3/uL — ABNORMAL LOW (ref 150–400)
RBC: 3.05 MIL/uL — AB (ref 4.22–5.81)
RDW: 14.7 % (ref 11.5–15.5)
WBC: 7.8 10*3/uL (ref 4.0–10.5)

## 2013-10-04 MED ORDER — DARBEPOETIN ALFA-POLYSORBATE 60 MCG/0.3ML IJ SOLN
INTRAMUSCULAR | Status: AC
  Administered 2013-10-04: 20:00:00
  Filled 2013-10-04: qty 0.3

## 2013-10-04 MED ORDER — MUPIROCIN CALCIUM 2 % EX CREA
TOPICAL_CREAM | Freq: Two times a day (BID) | CUTANEOUS | Status: DC
  Administered 2013-10-04 – 2013-10-09 (×10): via TOPICAL
  Filled 2013-10-04: qty 15

## 2013-10-04 NOTE — Progress Notes (Signed)
Patient ID: Keith Vaughan, male   DOB: 06/04/1965, 47 y.o.   MRN: [id number]  [facility] Progress Note   Assessment/ Plan:   1. End-stage renal disease: Pt does NOT have outpt chair at [facility].  We are looking at other HD units and I suspect will have answer Monday or Tuesday next week.  Pt s/p L [facility] AVF and R [facility] 10/01/13 w/ VVS.  For HD today.  Cont on THS schedule.  Labs with HD today 2. Hyperkalemia: Corrected with hemodialysis, continue renal diet.  3. Hypertension: Stable 4. Anemia of chronic kidney disease: Iron stores appear adequate, on ESA  5. Thrombocytopenia: Stable 6. Metabolic bone disease: Cont lanthanum for now; Ca high  Subjective:   Plan to rec outpt HD at [facility] has fallen through Still looking for outpt chair   Objective:   BP 130/84  Pulse 66  Temp(Src) 97.8 F (36.6 C) (Oral)  Resp 18  Wt 53 kg (116 lb 13.5 oz)  SpO2 100%  Physical Exam: Gen: Comfortably sitting up in bed CVS: Pulse regular in rate and rhythm, S1 and S2 normal. Right IJ temporary dialysis catheter Resp: Good auscultation bilaterally, Schaab rales/rhonchi Abd: Soft, flat, nontender Ext: Ventresca lower extremity edema  Labs: BMET  Recent Labs Lab 09/30/13 0917 10/01/13 0345  NA 138 137  K 4.2 4.0  CL 94* 94*  CO2 24 26  GLUCOSE 88 90  BUN 61* 26*  CREATININE 9.84* 6.02*  CALCIUM 10.1 10.5  PHOS 5.3*  --    CBC  Recent Labs Lab 09/30/13 0917 10/01/13 0345  WBC 6.3 7.0  HGB 9.1* 9.5*  HCT 27.3* 29.1*  MCV 84.8 85.8  PLT 146* 124*    Medications:    . amLODipine  10 mg Oral Daily  . darbepoetin (ARANESP) injection - DIALYSIS  60 mcg Intravenous Q Sat-HD  . heparin subcutaneous  5,000 Units Subcutaneous 3 times per day  . isoniazid  300 mg Oral Daily  . lanthanum  500 mg Oral TID WC  . lisinopril  10 mg Oral Daily  . vitamin B-6  50 mg Oral Daily  . sodium chloride  3 mL Intravenous Q12H   Pearson Grippe, MD 10/04/2013, 8:10 AM

## 2013-10-04 NOTE — Progress Notes (Signed)
10/04/2013 10:56 AM  Interpreter services via language line utilized during assessment.  Per interpreter, patient has Carlino questions at this time pertaining to his care.  Holte complaints of pain.  I explained the use of the call bell to the patient, as well as stressed the importance of calling if he needed any help, to which he verbalized understanding.  Medications reviewed with patient as well.  Will continue to monitor patient. Princella Pellegrini

## 2013-10-04 NOTE — Progress Notes (Signed)
Subjective: Conversation per [place] interpreter. Patient has Wien complaints today, denies any pain. Nephrology came by and said that patient will receive HD today. Also, plans for outpatient HD have changed and should be worked out by early next week.  Objective: Vital signs in last 24 hours: Filed Vitals:   10/03/13 0900 10/03/13 1803 10/03/13 2129 10/04/13 0502  BP: 155/80 160/71 153/89 130/84  Pulse: 69 75 64 66  Temp: 98.4 F (36.9 C) 98.4 F (36.9 C) 97.8 F (36.6 C) 97.8 F (36.6 C)  TempSrc: Oral Oral Oral Oral  Resp: 18 18 19 18   Weight:   116 lb 13.5 oz (53 kg)   SpO2: 98% 97% 96% 100%   Weight change: 2 lb 10.3 oz (1.2 kg)  Intake/Output Summary (Last 24 hours) at 10/04/13 0932 Last data filed at 10/04/13 0659  Gross per 24 hour  Intake    360 ml  Output      2 ml  Net    358 ml   Physical Exam General: alert, cooperative, converses in Burmese, sitting up in bed  HEENT: [place]/AT, EOMI, PERRL, pimple located in right nostril with erythema and swelling, mucus membranes moist Neck: Supple, Scholes JVD. Right IJ catheter in place, covered by clean bandage  CV: RRR, normal S1/S2, Chavarin m/g/r  Pulm: CTA bilaterally, Perritt wheezes, Derks cough  Ext: warm, moves all, Stickle edema. Small lipoma located on outside of left ankle, Gresham tenderness. Left radiocephalic fistula on forearm, thrill present.  Msk: normal ROM, Navedo edema  Neuro: alert, oriented to person, place, and time. Fontanella CN deficits. Strength intact.  Skin: Tattoos on left forearm and both arms.   Lab Results: Basic Metabolic Panel:  Recent Labs Lab 09/30/13 0917 10/01/13 0345  NA 138 137  K 4.2 4.0  CL 94* 94*  CO2 24 26  GLUCOSE 88 90  BUN 61* 26*  CREATININE 9.84* 6.02*  CALCIUM 10.1 10.5  PHOS 5.3*  --    Liver Function Tests:  Recent Labs Lab 09/30/13 0917  ALBUMIN 3.6   Van[place]pool results found for this basename: LIPASE, AMYLASE,  in the last 168 hours Klahn results found for this basename: AMMONIA,  in the last 168  hours CBC:  Recent Labs Lab 09/30/13 0917 10/01/13 0345  WBC 6.3 7.0  HGB 9.1* 9.5*  HCT 27.3* 29.1*  MCV 84.8 85.8  PLT 146* 124*   Cardiac Enzymes: Cosens results found for this basename: CKTOTAL, CKMB, CKMBINDEX, TROPONINI,  in the last 168 hours BNP: Fleece results found for this basename: PROBNP,  in the last 168 hours D-Dimer: Azbell results found for this basename: DDIMER,  in the last 168 hours CBG: Stukey results found for this basename: GLUCAP,  in the last 168 hours Hemoglobin A1C: Simao results found for this basename: HGBA1C,  in the last 168 hours Fasting Lipid Panel: Luckadoo results found for this basename: CHOL, HDL, LDLCALC, TRIG, CHOLHDL, LDLDIRECT,  in the last 168 hours Thyroid Function Tests: Englander results found for this basename: TSH, T4TOTAL, FREET4, T3FREE, THYROIDAB,  in the last 168 hours Coagulation: Kroening results found for this basename: LABPROT, INR,  in the last 168 hours Anemia Panel: Teel results found for this basename: VITAMINB12, FOLATE, FERRITIN, TIBC, IRON, RETICCTPCT,  in the last 168 hours Urine Drug Screen: Drugs of Abuse  Teem results found for this basename: labopia,  cocainscrnur,  labbenz,  amphetmu,  thcu,  labbarb    Alcohol Level: Dilorenzo results found for this basename: ETH,  in the last 168 hours Urinalysis: Homewood results found for this basename: COLORURINE, APPERANCEUR, LABSPEC, Weaver, GLUCOSEU, HGBUR, BILIRUBINUR, KETONESUR, PROTEINUR, UROBILINOGEN, NITRITE, LEUKOCYTESUR,  in the last 168 hours   Micro Results: Recent Results (from the past 240 hour(s))  SURGICAL PCR SCREEN     Status: None   Collection Time    10/01/13  7:05 AM      Result Value Ref Range Status   MRSA, PCR NEGATIVE  NEGATIVE Final   Staphylococcus aureus NEGATIVE  NEGATIVE Final   Comment:            The Xpert SA Assay (FDA     approved for NASAL specimens     in patients over 32 years of age),     is one component of     a comprehensive surveillance     program.  Test performance  has     been validated by [employer] for patients greater     than or equal to 40 year old.     It is not intended     to diagnose infection nor to     guide or monitor treatment.   Studies/Results: Donath results found. Medications: I have reviewed the patient's current medications. Scheduled Meds: . amLODipine  10 mg Oral Daily  . darbepoetin (ARANESP) injection - DIALYSIS  60 mcg Intravenous Q Sat-HD  . heparin subcutaneous  5,000 Units Subcutaneous 3 times per day  . isoniazid  300 mg Oral Daily  . lanthanum  500 mg Oral TID WC  . lisinopril  10 mg Oral Daily  . mupirocin cream   Topical BID  . vitamin B-6  50 mg Oral Daily  . sodium chloride  3 mL Intravenous Q12H   Continuous Infusions: . sodium chloride 35 mL/hr (10/01/13 0920)   PRN Meds:.acetaminophen, acetaminophen, hydrALAZINE, oxyCODONE Assessment/Plan: Mr. Doo is a 48yo man, refugee from [place], [place] speaker, with PMH of ESRD on HD 3 times weekly and HTN presenting to establish care in [place].   1. ESRD on HD - The patient is a refugee from [place], with last HD July 9th prior to his arrival to the [place]. Per medical records from [place], his first HD was in December 2014 at [facility] likely due to uncontrolled hypertension. His vascular access has been Right IJ which was placed in December 2014. Last HD screening labs in 6/30 drawn at [facility] with HIV 1/2 non reactive, hep B antigen nonreactive, hep B antibody >1000.0 mIU/ml, anti-HCV non reactive (all these repeat labs have been negative). CXR from March 5th 2015 with "right IJ cath in-situ with borderline cardiomegaly". CXR here shows cardiomegaly, with Jenning focal region of consolidation nor focal infiltrates, interstitial findings consistent with pulmonary edema.  - Nephrology consulted, appreciate help with inpatient HD and setting up outpatient HD  - Renal diet--pt prefers rice and vegetables and [place] beef or pork products  -  Vascular Surgery consulted for AVF placement (vein mapping completed 09/27/13). Patient underwent left radial-cephalic AV fistula placement on 10/02/13. Pt to follow up in 4-6 weeks with Dr. Kellie Simmering.  - Hep A and B vaccines given on 09/27/13  - PPD positive, 15x70mm, red, raised. Patient asymptomatic. Quantiferon positive. 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 Discussed with Dr. Tommy Medal from ID and he recommended Isoniazid for 9 months. There is Watterson need for negative pressure or respiratory isolation at this time.  2. HTN - BP as high as 200/116 on admission before HD but is now normal. His usual antihypertensive regimen include amlodipine 10mg  daily and enalapril 10mg  daily.  - Continue Amlodipine 10mg  PO daily and Lisinopril 10mg  PO daily  - Hydralazine 5mg  PRN for SPB >180   3. Anemia of chronic disease - Last Hg on 7/6 at 6.6>>8.9>>9.5 on 7/15 , per [place] medical report it appears that he had been receiving Erythropoietin 6000 U once per week since Jun 22 and received 2 units of pRBC during his last HD session on July 9th. Iron panel shows Fe 103, TIBC 190, Ferritin 938 which is consistent with anemia of chronic disease as expected.  - Continue to monitor   4. Thrombocytopenia - Chronic issue for him. Platelets of 91>>101>>124 on 10/01/13. Eisenhart bleeding noted.  - Continue to monitor   5. Refugee status - Fackler signs of active TB. PPD+, neg CXR, +quantiferon, started on Isoniazid. IOM contact is Leanora Ivanoff [phone]. Dr. Hayes Ludwig spoke with her and she states that the patient already has Medicaid application in process as well as housing, food, and other community help. She will be in contact with the [facility] social worker and case managers. Prior to his departure from [place], the patient has been deemed independent of his ADLs and will likely not need ALF or SNF placement. This was confirmed with PT/OT, patient can complete all ADLs independently.  - Social work/Care  management consulted for placement/housing assistance. Talked with patient, Lanae Boast ([place]) and case Freight forwarder, Malachy Mood, yesterday and CLIP process has begun now that address established. Nephrology (Dr. Joelyn Oms) will be setting up outpatient HD.  - Patient will need PCP follow up. I would be happy to see patient in outpatient IM clinic- Pt agreed. Appointment set up for August 3rd @ 10:00AM.   Diet: Renal diet, [place] beef or pork products  DVT prophylaxis: SCDs  Dispo: Anticipated discharge next week, possibly Thursday.       The patient does have a current PCP ([name], MD) and does need an [facility] hospital follow-up appointment after discharge.  The patient does not have transportation limitations that hinder transportation to clinic appointments.  .Services Needed at time of discharge: Y = Yes, Blank = [place] PT:   OT:   RN:   Equipment:   Other:     LOS: 8 days   [name], MD 10/04/2013, 9:32 AM

## 2013-10-05 ENCOUNTER — Encounter (HOSPITAL_COMMUNITY): Payer: Self-pay | Admitting: Oncology

## 2013-10-05 DIAGNOSIS — A159 Respiratory tuberculosis unspecified: Secondary | ICD-10-CM

## 2013-10-05 HISTORY — DX: Respiratory tuberculosis unspecified: A15.9

## 2013-10-05 NOTE — Progress Notes (Signed)
Subjective: Patient has Ackman complaints today. Awaiting placement for HD bed.  Objective: Vital signs in last 24 hours: Filed Vitals:   10/04/13 1930 10/04/13 1950 10/04/13 2014 10/05/13 0521  BP: 179/99 168/101 177/95 157/85  Pulse: 66 63 72 65  Temp:  98.5 F (36.9 C) 98.1 F (36.7 C) 98.2 F (36.8 C)  TempSrc:  Oral Oral Oral  Resp:  16 16 17   Weight:  110 lb 14.3 oz (50.3 kg) 110 lb 14.3 oz (50.3 kg)   SpO2:  100% 97% 97%   Weight change: -3 lb 12 oz (-1.7 kg)  Intake/Output Summary (Last 24 hours) at 10/05/13 1006 Last data filed at 10/05/13 0900  Gross per 24 hour  Intake    600 ml  Output    814 ml  Net   -214 ml   Physical Exam General: alert, cooperative, converses in Burmese, sitting up in bed  HEENT: [place]/AT, EOMI, PERRL, mucus membranes moist  Neck: Supple, Oka JVD. Right IJ catheter in place, covered by clean bandage  CV: RRR, normal S1/S2, Koenig m/g/r  Pulm: CTA bilaterally, Gras wheezes, Pike cough  Ext: warm, moves all, Boulden edema. Small lipoma located on outside of left ankle, Pudwill tenderness. Left radiocephalic fistula on forearm, thrill present.  Msk: normal ROM, Anfinson edema  Neuro: alert, oriented to person, place, and time. Carta CN deficits. Strength intact.  Skin: Tattoos on left forearm and both arms.   Lab Results: Basic Metabolic Panel:  Recent Labs Lab 09/30/13 0917 10/01/13 0345 10/04/13 1550  NA 138 137 137  K 4.2 4.0 5.0  CL 94* 94* 92*  CO2 24 26 24   GLUCOSE 88 90 95  BUN 61* 26* 58*  CREATININE 9.84* 6.02* 9.85*  CALCIUM 10.1 10.5 10.5  PHOS 5.3*  --  7.2*   Liver Function Tests:  Recent Labs Lab 09/30/13 0917 10/04/13 1550  ALBUMIN 3.6 3.6   Galloway results found for this basename: LIPASE, AMYLASE,  in the last 168 hours Visconti results found for this basename: AMMONIA,  in the last 168 hours CBC:  Recent Labs Lab 10/01/13 0345 10/04/13 1550  WBC 7.0 7.8  HGB 9.5* 9.0*  HCT 29.1* 26.7*  MCV 85.8 87.5  PLT 124* 129*   Cardiac  Enzymes: Dias results found for this basename: CKTOTAL, CKMB, CKMBINDEX, TROPONINI,  in the last 168 hours BNP: Gianfrancesco results found for this basename: PROBNP,  in the last 168 hours D-Dimer: Rineer results found for this basename: DDIMER,  in the last 168 hours CBG: Fullwood results found for this basename: GLUCAP,  in the last 168 hours Hemoglobin A1C: Forness results found for this basename: HGBA1C,  in the last 168 hours Fasting Lipid Panel: Cornell results found for this basename: CHOL, HDL, LDLCALC, TRIG, CHOLHDL, LDLDIRECT,  in the last 168 hours Thyroid Function Tests: Remsen results found for this basename: TSH, T4TOTAL, FREET4, T3FREE, THYROIDAB,  in the last 168 hours Coagulation: Falcon results found for this basename: LABPROT, INR,  in the last 168 hours Anemia Panel: Greek results found for this basename: VITAMINB12, FOLATE, FERRITIN, TIBC, IRON, RETICCTPCT,  in the last 168 hours Urine Drug Screen: Drugs of Abuse  Gilkerson results found for this basename: labopia, cocainscrnur, labbenz, amphetmu, thcu, labbarb    Alcohol Level: Shane results found for this basename: ETH,  in the last 168 hours Urinalysis: Lenzen results found for this basename: COLORURINE, APPERANCEUR, LABSPEC, PHURINE, GLUCOSEU, HGBUR, BILIRUBINUR, KETONESUR, PROTEINUR, UROBILINOGEN, NITRITE, LEUKOCYTESUR,  in the last  168 hours   Micro Results: Recent Results (from the past 240 hour(s))  SURGICAL PCR SCREEN     Status: None   Collection Time    10/01/13  7:05 AM      Result Value Ref Range Status   MRSA, PCR NEGATIVE  NEGATIVE Final   Staphylococcus aureus NEGATIVE  NEGATIVE Final   Comment:            The Xpert SA Assay (FDA     approved for NASAL specimens     in patients over 65 years of age),     is one component of     a comprehensive surveillance     program.  Test performance has     been validated by [employer] for patients greater     than or equal to 54 year old.     It is not intended     to diagnose infection nor to      guide or monitor treatment.   Studies/Results: Reith results found. Medications: I have reviewed the patient's current medications. Scheduled Meds: . amLODipine  10 mg Oral Daily  . darbepoetin (ARANESP) injection - DIALYSIS  60 mcg Intravenous Q Sat-HD  . heparin subcutaneous  5,000 Units Subcutaneous 3 times per day  . isoniazid  300 mg Oral Daily  . lanthanum  500 mg Oral TID WC  . lisinopril  10 mg Oral Daily  . mupirocin cream   Topical BID  . vitamin B-6  50 mg Oral Daily  . sodium chloride  3 mL Intravenous Q12H   Continuous Infusions: . sodium chloride 35 mL/hr (10/01/13 0920)   PRN Meds:.acetaminophen, acetaminophen, hydrALAZINE, oxyCODONE Assessment/Plan: Keith Vaughan is a 48yo man, refugee from [place], [place] speaker, with PMH of ESRD on HD 3 times weekly and HTN presenting to establish care in [place].   1. ESRD on HD (TTS): The patient is a refugee from [place]. Per medical records from [place], his first HD was in December 2014 at [facility] likely due to uncontrolled hypertension. His vascular access has been Right IJ which was placed in December 2014. Last HD screening labs in 6/30 drawn at [facility] with HIV 1/2 non reactive, hep B antigen nonreactive, hep B antibody >1000.0 mIU/ml, anti-HCV non reactive (all these repeat labs have been negative). CXR from March 5th 2015 with "right IJ cath in-situ with borderline cardiomegaly". CXR here shows cardiomegaly, with Langdon focal region of consolidation nor focal infiltrates, interstitial findings consistent with pulmonary edema.  - Nephrology consulted, appreciate help with inpatient HD and setting up outpatient HD  - Renal diet--pt prefers rice and vegetables and Henton beef or pork products  - Vascular Surgery consulted for AVF placement (vein mapping completed 09/27/13). Patient underwent left radial-cephalic AV fistula placement on 10/02/13. Pt to follow up in 4-6 weeks with Dr. Kellie Simmering.  - PPD  positive, 15x29mm, red, raised. Patient asymptomatic. Quantiferon positive. 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 Discussed with Dr. Tommy Medal from ID and he recommended Isoniazid for 9 months. There is Heffron need for negative pressure or respiratory isolation at this time.   2. HTN: BP as high as 200/116 on admission before HD but is now normal. His usual antihypertensive regimen include amlodipine 10mg  daily and enalapril 10mg  daily.  - Continue Amlodipine 10mg  PO daily and Lisinopril 10mg  PO daily  - Hydralazine 5mg  PRN for SPB >180   3.  Anemia of chronic disease: Last Hg on 7/6 at 6.6>>8.9>>9.5>>9.0 on 7/18 , per [place] medical report it appears that he had been receiving Erythropoietin 6000 U once per week since Jun 22 and received 2 units of pRBC during his last HD session on July 9th. Iron panel shows Fe 103, TIBC 190, Ferritin 938 which is consistent with anemia of chronic disease as expected.  - Continue to monitor   4. Thrombocytopenia - Chronic issue for him. Platelets of 91>>101>>124>>129 on 10/04/13. Mis bleeding noted.  - Continue to monitor   5. Refugee status - Rex signs of active TB. PPD+, neg CXR, +quantiferon, started on Isoniazid. IOM contact is Leanora Ivanoff [phone]. Dr. Hayes Ludwig spoke with her and she states that the patient already has Medicaid application in process as well as housing, food, and other community help. She will be in contact with the [facility] social worker and case managers. Prior to his departure from [place], the patient has been deemed independent of his ADLs and will likely not need ALF or SNF placement. This was confirmed with PT/OT, patient can complete all ADLs independently.  - Social work/Care management consulted for placement/housing assistance. Talked with patient, Lanae Boast ([place] rep) and case manager, [place], on 7/17 and CLIP process has begun now that address established. Nephrology (Dr. Joelyn Oms) will  be setting up outpatient HD.  - Patient will need PCP follow up. I would be happy to see patient in outpatient IM clinic- Pt agreed. Appointment set up for August 3rd @ 10:00AM.   Diet: Renal diet, Feltus beef or pork products  DVT prophylaxis: SCDs  Dispo: Anticipated discharge next week, possibly Thursday, 7/23.    The patient does have a current PCP ([name], MD) and does need an [facility] hospital follow-up appointment after discharge.  The patient does not have transportation limitations that hinder transportation to clinic appointments.  .Services Needed at time of discharge: Y = Yes, Blank = [place] PT:   OT:   RN:   Equipment:   Other:     LOS: 9 days   [name], MD 10/05/2013, 10:06 AM

## 2013-10-05 NOTE — Progress Notes (Signed)
Patient ID: Keith Vaughan, male   DOB: 06-25-1965, 48 y.o.   MRN: [id number] Attending physician note: I personally examined this patient today together with resident physician Dr. [name] and I concur with her evaluation and management plan. The patient is awaiting housing and transition to outpatient dialysis. He has been started on oral isoniazid 300 mg daily for his latent tuberculosis. Recommendation is to continue for 9 months. Blood pressure control still not optimal.

## 2013-10-05 NOTE — Plan of Care (Signed)
Problem: Phase III Progression Outcomes Goal: Other Phase III Outcomes/Goals Outcome: Progressing Anticipated discharge following center placement.

## 2013-10-05 NOTE — Progress Notes (Signed)
Patient ID: Keith Vaughan, male   DOB: [birth year], 48 y.o.   MRN: [id number]  [facility] Progress Note   Assessment/ Plan:   1. End-stage renal disease: Pt does NOT have outpt chair at [facility].  We are looking at other HD units and I suspect will have answer Monday or Tuesday next week.  Pt s/p L RC AVF and R [facility] 10/01/13 w/ VVS.  For HD next 7/21.  Cont on THS schedule.   2. Mild hypercalcemia: Not on VDRA or PO Ca.  Cont on 2.25 Ca bath 3. Hypertension: Stable 4. Anemia of chronic kidney disease: Iron stores appear adequate, on ESA  5. Thrombocytopenia: Stable 6. Metabolic bone disease: Cont lanthanum for now; Ca high  Subjective:   [name] new events HD yesterday   Objective:   BP 157/85  Pulse 65  Temp(Src) 98.2 F (36.8 C) (Oral)  Resp 17  Wt 50.3 kg (110 lb 14.3 oz)  SpO2 97%  Physical Exam: Gen: Comfortably sitting up in bed CVS: Pulse regular in rate and rhythm, S1 and S2 normal. Right IJ temporary dialysis catheter Resp: Good auscultation bilaterally, Saxe rales/rhonchi Abd: Soft, flat, nontender Ext: Sagrero lower extremity edema  Labs: BMET  Recent Labs Lab 09/30/13 0917 10/01/13 0345 10/04/13 1550  NA 138 137 137  K 4.2 4.0 5.0  CL 94* 94* 92*  CO2 24 26 24   GLUCOSE 88 90 95  BUN 61* 26* 58*  CREATININE 9.84* 6.02* 9.85*  CALCIUM 10.1 10.5 10.5  PHOS 5.3*  --  7.2*   CBC  Recent Labs Lab 09/30/13 0917 10/01/13 0345 10/04/13 1550  WBC 6.3 7.0 7.8  HGB 9.1* 9.5* 9.0*  HCT 27.3* 29.1* 26.7*  MCV 84.8 85.8 87.5  PLT 146* 124* 129*    Medications:    . amLODipine  10 mg Oral Daily  . darbepoetin (ARANESP) injection - DIALYSIS  60 mcg Intravenous Q Sat-HD  . heparin subcutaneous  5,000 Units Subcutaneous 3 times per day  . isoniazid  300 mg Oral Daily  . lanthanum  500 mg Oral TID WC  . lisinopril  10 mg Oral Daily  . mupirocin cream   Topical BID  . vitamin B-6  50 mg Oral Daily  . sodium chloride  3 mL Intravenous Q12H   Pearson Grippe,  MD 10/05/2013, 8:50 AM

## 2013-10-06 NOTE — Progress Notes (Signed)
Subjective: Patient doing well today. He has Underdown complaints. Awaiting placement for outpatient dialysis bed.   Objective: Vital signs in last 24 hours: Filed Vitals:   10/05/13 1757 10/05/13 1930 10/05/13 2120 10/06/13 0445  BP: 158/90  148/89 151/92  Pulse: 71  69 67  Temp: 98.8 F (37.1 C)  98 F (36.7 C) 97.7 F (36.5 C)  TempSrc: Oral  Oral Oral  Resp: 21  18 18   Height:  5\' 2"  (1.575 m)    Weight:      SpO2: 98%  95% 97%   Weight change:   Intake/Output Summary (Last 24 hours) at 10/06/13 0848 Last data filed at 10/05/13 1700  Gross per 24 hour  Intake    720 ml  Output      0 ml  Net    720 ml   Physical Exam General: alert, cooperative, converses in Burmese, sitting up in chair HEENT: [place]/AT, EOMI, PERRL, mucus membranes moist  Neck: Supple, Blankenhorn JVD. Right IJ catheter in place, covered by clean bandage  CV: RRR, normal S1/S2, Hennen m/g/r  Pulm: CTA bilaterally, Linz wheezes, Collyer cough  Ext: warm, moves all, Kijowski edema. Small lipoma located on outside of left ankle, Holle tenderness. Left radiocephalic fistula on forearm, thrill present.  Msk: normal ROM, Wickliff edema  Neuro: alert, oriented to person, place, and time. Salado CN deficits. Strength intact.  Skin: Tattoos on left forearm and both arms.   Lab Results: Basic Metabolic Panel:  Recent Labs Lab 09/30/13 0917 10/01/13 0345 10/04/13 1550  NA 138 137 137  K 4.2 4.0 5.0  CL 94* 94* 92*  CO2 24 26 24   GLUCOSE 88 90 95  BUN 61* 26* 58*  CREATININE 9.84* 6.02* 9.85*  CALCIUM 10.1 10.5 10.5  PHOS 5.3*  --  7.2*   Liver Function Tests:  Recent Labs Lab 09/30/13 0917 10/04/13 1550  ALBUMIN 3.6 3.6   Jaquith results found for this basename: LIPASE, AMYLASE,  in the last 168 hours Salasar results found for this basename: AMMONIA,  in the last 168 hours CBC:  Recent Labs Lab 10/01/13 0345 10/04/13 1550  WBC 7.0 7.8  HGB 9.5* 9.0*  HCT 29.1* 26.7*  MCV 85.8 87.5  PLT 124* 129*   Cardiac Enzymes: Deskins results found  for this basename: CKTOTAL, CKMB, CKMBINDEX, TROPONINI,  in the last 168 hours BNP: Pfister results found for this basename: PROBNP,  in the last 168 hours D-Dimer: Poteet results found for this basename: DDIMER,  in the last 168 hours CBG: Kainz results found for this basename: GLUCAP,  in the last 168 hours Hemoglobin A1C: Budzinski results found for this basename: HGBA1C,  in the last 168 hours Fasting Lipid Panel: Rathbun results found for this basename: CHOL, HDL, LDLCALC, TRIG, CHOLHDL, LDLDIRECT,  in the last 168 hours Thyroid Function Tests: Vereen results found for this basename: TSH, T4TOTAL, FREET4, T3FREE, THYROIDAB,  in the last 168 hours Coagulation: Causer results found for this basename: LABPROT, INR,  in the last 168 hours Anemia Panel: Minshall results found for this basename: VITAMINB12, FOLATE, FERRITIN, TIBC, IRON, RETICCTPCT,  in the last 168 hours Urine Drug Screen: Drugs of Abuse  Gaughran results found for this basename: labopia, cocainscrnur, labbenz, amphetmu, thcu, labbarb    Alcohol Level: Weger results found for this basename: ETH,  in the last 168 hours Urinalysis: Looman results found for this basename: COLORURINE, APPERANCEUR, LABSPEC, PHURINE, GLUCOSEU, HGBUR, BILIRUBINUR, KETONESUR, PROTEINUR, UROBILINOGEN, NITRITE, LEUKOCYTESUR,  in the  last 168 hours   Micro Results: Recent Results (from the past 240 hour(s))  SURGICAL PCR SCREEN     Status: None   Collection Time    10/01/13  7:05 AM      Result Value Ref Range Status   MRSA, PCR NEGATIVE  NEGATIVE Final   Staphylococcus aureus NEGATIVE  NEGATIVE Final   Comment:            The Xpert SA Assay (FDA     approved for NASAL specimens     in patients over 39 years of age),     is one component of     a comprehensive surveillance     program.  Test performance has     been validated by [employer] for patients greater     than or equal to 40 year old.     It is not intended     to diagnose infection nor to     guide or monitor  treatment.   Studies/Results: Markel results found. Medications: I have reviewed the patient's current medications. Scheduled Meds: . amLODipine  10 mg Oral Daily  . darbepoetin (ARANESP) injection - DIALYSIS  60 mcg Intravenous Q Sat-HD  . heparin subcutaneous  5,000 Units Subcutaneous 3 times per day  . isoniazid  300 mg Oral Daily  . lanthanum  500 mg Oral TID WC  . lisinopril  10 mg Oral Daily  . mupirocin cream   Topical BID  . vitamin B-6  50 mg Oral Daily  . sodium chloride  3 mL Intravenous Q12H   Continuous Infusions:  PRN Meds:.acetaminophen, acetaminophen, hydrALAZINE, oxyCODONE Assessment/Plan: Mr. Koch is a 48yo man, refugee from [place], [place] speaker, with PMH of ESRD on HD 3 times weekly and HTN presenting to establish care in [place].   1. ESRD on HD (TTS): The patient is a refugee from [place]. Per medical records from [place], his first HD was in December 2014 at [facility] likely due to uncontrolled hypertension. His vascular access has been Right IJ which was placed in December 2014. Last HD screening labs in 6/30 drawn at [facility] at [facility] with HIV 1/2 non reactive, hep B antigen nonreactive, hep B antibody >1000.0 mIU/ml, anti-HCV non reactive (all these repeat labs have been negative). CXR from March 5th 2015 with "right IJ cath in-situ with borderline cardiomegaly". CXR here shows cardiomegaly, with Acevedo focal region of consolidation nor focal infiltrates, interstitial findings consistent with pulmonary edema.  - Nephrology consulted, appreciate help with inpatient HD and setting up outpatient HD  - Renal diet--pt prefers rice and vegetables and Gest beef or pork products  - Vascular Surgery consulted for AVF placement (vein mapping completed 09/27/13). Patient underwent left radial-cephalic AV fistula placement on 10/02/13. Pt to follow up in 4-6 weeks with Dr. Kellie Simmering.  - PPD positive, 15x47mm, red, raised. Patient asymptomatic. Quantiferon  positive. 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 Discussed with Dr. Tommy Medal from ID and he recommended Isoniazid for 9 months. There is Mor[place] need for negative pressure or respiratory isolation at this time.   2. HTN: BP as high as 200/116 on admission before HD but is now normal. His usual antihypertensive regimen include amlodipine 10mg  daily and enalapril 10mg  daily.  - Continue Amlodipine 10mg  PO daily and Lisinopril 10mg  PO daily  - Hydralazine 5mg  PRN for SPB >180   3. Anemia of chronic disease: Last Hg on  7/6 at 6.6>>8.9>>9.5>>9.0 on 7/18 , per [place] medical report it appears that he had been receiving Erythropoietin 6000 U once per week since Jun 22 and received 2 units of pRBC during his last HD session on July 9th. Iron panel shows Fe 103, TIBC 190, Ferritin 938 which is consistent with anemia of chronic disease as expected.  - Continue to monitor   4. Thrombocytopenia - Chronic issue for him. Platelets of 91>>101>>124>>129 on 10/04/13. Saulters bleeding noted.  - Continue to monitor   5. Refugee status - Osburn signs of active TB. PPD+, neg CXR, +quantiferon, started on Isoniazid. IOM contact is Leanora Ivanoff [phone]. Dr. Hayes Ludwig spoke with her and she states that the patient already has Medicaid application in process as well as housing, food, and other community help. She will be in contact with the [facility] social worker and case managers. Prior to his departure from [place], the patient has been deemed independent of his ADLs and will likely not need ALF or SNF placement. This was confirmed with PT/OT, patient can complete all ADLs independently.  - Social work/Care management consulted for placement/housing assistance. Talked with patient, Lanae Boast ([place] rep) and case manager, [place], on 7/17 and CLIP process has begun now that address established. Nephrology (Dr. Joelyn Oms) will be setting up outpatient HD.  - Patient has PCP follow up  appointment set up for August 3rd @ 10:00AM in IM clinic.   Diet: Renal diet, Scoggins beef or pork products  DVT prophylaxis: SCDs  Dispo: Anticipated discharge on Thursday, 7/23   The patient does have a current PCP ([name], MD) and does need an [facility] hospital follow-up appointment after discharge.  The patient does not have transportation limitations that hinder transportation to clinic appointments.  .Services Needed at time of discharge: Y = Yes, Blank = Taglieri PT:   OT:   RN:   Equipment:   Other:     LOS: 10 days   [name], MD 10/06/2013, 8:48 AM

## 2013-10-06 NOTE — Progress Notes (Signed)
  [facility] Progress Note    Assessment/ Plan:   1. End-stage renal disease: Pt does NOT have outpt chair at [facility].  -- We are looking at other HD units. we should have a better idea by in the next 24-48hrs.  -- Pt s/p L RC AVF and R [facility] 10/01/13 w/ VVS --> appreciate help by VVS (beautiful fistula).  -- For HD Tuesday on TTS schedule.   2. Hyperkalemia: Corrected with hemodialysis, continue renal diet.  3. Hypertension: Stable; mildly elevated but ok for now. Challenge EDW as tolerated and may be able to titrate down on antihypertensives.  4. Anemia of chronic kidney disease: Iron stores appear adequate, on ESA  5. Thrombocytopenia: Stable  6. Metabolic bone disease: Cont lanthanum for now; Ca high. PTH only 65.1 (goal 150-300).   Subjective:   Used telephone translator this AM. He does not have any complaints of anorexia, nausea, headaches, visual complaints, diarrhea or abdominal pain. He also denied any pain in general. He does not want any more ice; for some reason it does not agree with him.   Objective:   BP 151/92  Pulse 67  Temp(Src) 97.7 F (36.5 C) (Oral)  Resp 18  Ht 5\' 2"  (1.575 m)  Wt 50.3 kg (110 lb 14.3 oz)  BMI 20.28 kg/m2  SpO2 97%  Intake/Output Summary (Last 24 hours) at 10/06/13 [id number] Last data filed at 10/05/13 1700  Gross per 24 hour  Intake    720 ml  Output      0 ml  Net    720 ml   Weight change:   Physical Exam: Gen: Comfortably sitting up in bed  CVS: Pulse regular in rate and rhythm, S1 and S2 normal. Right IJ temporary dialysis catheter  Left Cimino soft, good augmentation and strong thrill Resp: Good auscultation bilaterally, Mcclory rales/rhonchi  Abd: Soft, flat, nontender  Ext: Mcnair lower extremity edema  Imaging: Demeritt results found.  Labs: BMET  Recent Labs Lab 09/30/13 0917 10/01/13 0345 10/04/13 1550  NA 138 137 137  K 4.2 4.0 5.0  CL 94* 94* 92*  CO2 24 26 24   GLUCOSE 88 90 95  BUN 61* 26* 58*  CREATININE  9.84* 6.02* 9.85*  CALCIUM 10.1 10.5 10.5  PHOS 5.3*  --  7.2*   CBC  Recent Labs Lab 09/30/13 0917 10/01/13 0345 10/04/13 1550  WBC 6.3 7.0 7.8  HGB 9.1* 9.5* 9.0*  HCT 27.3* 29.1* 26.7*  MCV 84.8 85.8 87.5  PLT 146* 124* 129*    Medications:    . amLODipine  10 mg Oral Daily  . darbepoetin (ARANESP) injection - DIALYSIS  60 mcg Intravenous Q Sat-HD  . heparin subcutaneous  5,000 Units Subcutaneous 3 times per day  . isoniazid  300 mg Oral Daily  . lanthanum  500 mg Oral TID WC  . lisinopril  10 mg Oral Daily  . mupirocin cream   Topical BID  . vitamin B-6  50 mg Oral Daily  . sodium chloride  3 mL Intravenous Q12H      Otelia Santee, MD 10/06/2013, 7:18 AM

## 2013-10-07 MED ORDER — HEPARIN SODIUM (PORCINE) 1000 UNIT/ML DIALYSIS: 1000.0000 [IU] | INTRAMUSCULAR | Status: DC | PRN

## 2013-10-07 MED ORDER — LIDOCAINE HCL (PF) 1 % IJ SOLN: 5.0000 mL | INTRAMUSCULAR | Status: DC | PRN

## 2013-10-07 MED ORDER — ALTEPLASE 2 MG IJ SOLR
2.0000 mg | Freq: Once | INTRAMUSCULAR | Status: DC | PRN
  Filled 2013-10-07: qty 2

## 2013-10-07 MED ORDER — OXYCODONE HCL 5 MG PO TABS
ORAL_TABLET | ORAL | Status: AC
  Filled 2013-10-07: qty 1

## 2013-10-07 MED ORDER — SODIUM CHLORIDE 0.9 % IV SOLN: 100.0000 mL | INTRAVENOUS | Status: DC | PRN

## 2013-10-07 MED ORDER — HEPARIN SODIUM (PORCINE) 1000 UNIT/ML DIALYSIS
2000.0000 [IU] | INTRAMUSCULAR | Status: DC | PRN
Start: 2013-10-07 — End: 2013-10-07

## 2013-10-07 MED ORDER — LIDOCAINE-PRILOCAINE 2.5-2.5 % EX CREA
1.0000 "application " | TOPICAL_CREAM | CUTANEOUS | Status: DC | PRN
  Filled 2013-10-07: qty 5

## 2013-10-07 MED ORDER — PENTAFLUOROPROP-TETRAFLUOROETH EX AERO: 1.0000 "application " | INHALATION_SPRAY | CUTANEOUS | Status: DC | PRN

## 2013-10-07 MED ORDER — NEPRO/CARBSTEADY PO LIQD
237.0000 mL | ORAL | Status: DC | PRN
  Filled 2013-10-07: qty 237

## 2013-10-07 MED ORDER — HEPARIN SODIUM (PORCINE) 1000 UNIT/ML DIALYSIS: 20.0000 [IU]/kg | INTRAMUSCULAR | Status: DC | PRN

## 2013-10-07 NOTE — Progress Notes (Signed)
Subjective: Patient has Lauritsen complaints today. Awaiting HD bed placement. Per nephrology, should know within next 24-48 hours.  Objective: Vital signs in last 24 hours: Filed Vitals:   10/06/13 0445 10/06/13 1814 10/06/13 2052 10/07/13 0519  BP: 151/92 152/89 146/101 151/86  Pulse: 67 76 73 75  Temp: 97.7 F (36.5 C) 97.9 F (36.6 C) 97.6 F (36.4 C) 98.3 F (36.8 C)  TempSrc: Oral Oral Oral Oral  Resp: 18 19 18 16   Height:      Weight:      SpO2: 97% 100% 96% 96%   Weight change:   Intake/Output Summary (Last 24 hours) at 10/07/13 0936 Last data filed at 10/07/13 [id number]  Gross per 24 hour  Intake    660 ml  Output      0 ml  Net    660 ml   Physical Exam General: alert, cooperative, converses in Burmese, sitting up in bed HEENT: [place]/AT, EOMI, PERRL, mucus membranes moist  Neck: Supple, Furuya JVD. Right IJ catheter in place, covered by clean bandage  CV: RRR, normal S1/S2, Tuckerman m/g/r  Pulm: CTA bilaterally, Grimaldo wheezes, Steenbergen cough  Ext: warm, moves all, Debono edema. Small lipoma located on outside of left ankle, Bart tenderness. Left radiocephalic fistula on forearm, thrill present.  Msk: normal ROM, Panico edema  Neuro: alert, oriented to person, place, and time. Alford CN deficits. Strength intact.  Skin: Tattoos on left forearm and both arms.   Lab Results: Basic Metabolic Panel:  Recent Labs Lab 10/01/13 0345 10/04/13 1550  NA 137 137  K 4.0 5.0  CL 94* 92*  CO2 26 24  GLUCOSE 90 95  BUN 26* 58*  CREATININE 6.02* 9.85*  CALCIUM 10.5 10.5  PHOS  --  7.2*   Liver Function Tests:  Recent Labs Lab 10/04/13 1550  ALBUMIN 3.6   Fairbairn results found for this basename: LIPASE, AMYLASE,  in the last 168 hours Dattilio results found for this basename: AMMONIA,  in the last 168 hours CBC:  Recent Labs Lab 10/01/13 0345 10/04/13 1550  WBC 7.0 7.8  HGB 9.5* 9.0*  HCT 29.1* 26.7*  MCV 85.8 87.5  PLT 124* 129*   Cardiac Enzymes: Goller results found for this basename: CKTOTAL, CKMB,  CKMBINDEX, TROPONINI,  in the last 168 hours BNP: Beauchaine results found for this basename: PROBNP,  in the last 168 hours D-Dimer: Kimmel results found for this basename: DDIMER,  in the last 168 hours CBG: Griebel results found for this basename: GLUCAP,  in the last 168 hours Hemoglobin A1C: Brees results found for this basename: HGBA1C,  in the last 168 hours Fasting Lipid Panel: Pries results found for this basename: CHOL, HDL, LDLCALC, TRIG, CHOLHDL, LDLDIRECT,  in the last 168 hours Thyroid Function Tests: Lile results found for this basename: TSH, T4TOTAL, FREET4, T3FREE, THYROIDAB,  in the last 168 hours Coagulation: Salay results found for this basename: LABPROT, INR,  in the last 168 hours Anemia Panel: Mill results found for this basename: VITAMINB12, FOLATE, FERRITIN, TIBC, IRON, RETICCTPCT,  in the last 168 hours Urine Drug Screen: Drugs of Abuse  Mell results found for this basename: labopia, cocainscrnur, labbenz, amphetmu, thcu, labbarb    Alcohol Level: Standage results found for this basename: ETH,  in the last 168 hours Urinalysis: Herbison results found for this basename: COLORURINE, APPERANCEUR, LABSPEC, PHURINE, GLUCOSEU, HGBUR, BILIRUBINUR, KETONESUR, PROTEINUR, UROBILINOGEN, NITRITE, LEUKOCYTESUR,  in the last 168 hours   Micro Results: Recent Results (from the past  240 hour(s))  SURGICAL PCR SCREEN     Status: None   Collection Time    10/01/13  7:05 AM      Result Value Ref Range Status   MRSA, PCR NEGATIVE  NEGATIVE Final   Staphylococcus aureus NEGATIVE  NEGATIVE Final   Comment:            The Xpert SA Assay (FDA     approved for NASAL specimens     in patients over 30 years of age),     is one component of     a comprehensive surveillance     program.  Test performance has     been validated by [employer] for patients greater     than or equal to 10 year old.     It is not intended     to diagnose infection nor to     guide or monitor treatment.   Studies/Results: Bickley  results found. Medications: I have reviewed the patient's current medications. Scheduled Meds: . amLODipine  10 mg Oral Daily  . darbepoetin (ARANESP) injection - DIALYSIS  60 mcg Intravenous Q Sat-HD  . heparin subcutaneous  5,000 Units Subcutaneous 3 times per day  . isoniazid  300 mg Oral Daily  . lanthanum  500 mg Oral TID WC  . lisinopril  10 mg Oral Daily  . mupirocin cream   Topical BID  . vitamin B-6  50 mg Oral Daily  . sodium chloride  3 mL Intravenous Q12H   Continuous Infusions:  PRN Meds:.acetaminophen, acetaminophen, hydrALAZINE, oxyCODONE Assessment/Plan: Mr. Claire is a 48yo man, refugee from [place], [place] speaker, with PMH of ESRD on HD 3 times weekly and HTN presenting to establish care in [place].   1. ESRD on HD (TTS): The patient is a refugee from [place]. Per medical records from [place], his first HD was in December 2014 at [facility] likely due to uncontrolled hypertension. His vascular access has been Right IJ which was placed in December 2014. Last HD screening labs in 6/30 drawn at [facility] at [facility] with HIV 1/2 non reactive, hep B antigen nonreactive, hep B antibody >1000.0 mIU/ml, anti-HCV non reactive (all these repeat labs have been negative). CXR from March 5th 2015 with "right IJ cath in-situ with borderline cardiomegaly". CXR here shows cardiomegaly, with Victorio focal region of consolidation nor focal infiltrates, interstitial findings consistent with pulmonary edema.  - Nephrology consulted, appreciate help with inpatient HD and setting up outpatient HD  - Renal diet--pt prefers rice and vegetables and [place] beef or pork products  - Vascular Surgery consulted for AVF placement (vein mapping completed 09/27/13). Patient underwent left radial-cephalic AV fistula placement on 10/02/13. Pt to follow up in 4-6 weeks with Dr. Kellie Simmering.  - PPD positive, 15x25mm, red, raised. Patient asymptomatic. Quantiferon positive. PPD likely positive from BCG  vaccine. 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 Discussed with Dr. Tommy Medal from ID and he recommended Isoniazid for 9 months. There is Zucco need for negative pressure or respiratory isolation at this time.   2. HTN: BP as high as 200/116 on admission before HD but is now normal. His usual antihypertensive regimen include amlodipine 10mg  daily and enalapril 10mg  daily.  - Continue Amlodipine 10mg  PO daily and Lisinopril 10mg  PO daily  - Hydralazine 5mg  PRN for SPB >180   3. Anemia of chronic disease: Last Hg on 7/6 at 6.6>>8.9>>9.5>>9.0 on 7/18 , per [place] medical report it appears  that he had been receiving Erythropoietin 6000 U once per week since Jun 22 and received 2 units of pRBC during his last HD session on July 9th. Iron panel shows Fe 103, TIBC 190, Ferritin 938 which is consistent with anemia of chronic disease as expected.  - Continue to monitor   4. Thrombocytopenia - Chronic issue for him. Platelets of 91>>101>>124>>129 on 10/04/13. Wegener bleeding noted.  - Continue to monitor   5. Refugee status - Senft signs of active TB. PPD+, neg CXR, +quantiferon, started on Isoniazid. IOM contact is Leanora Ivanoff [phone]. Dr. Hayes Ludwig spoke with her and she states that the patient already has Medicaid application in process as well as housing, food, and other community help. She will be in contact with the [facility] social worker and case managers. Prior to his departure from [place], the patient has been deemed independent of his ADLs and will likely not need ALF or SNF placement. This was confirmed with PT/OT, patient can complete all ADLs independently.  - Social work/Care management consulted for placement/housing assistance. Talked with patient, Lanae Boast ([place] rep) and case manager, [place], on 7/17 and CLIP process has begun now that address established. Nephrology (Dr. Joelyn Oms) will be setting up outpatient HD--> should have better idea of HD bed placement in next 24-48 hours - Patient  has PCP follow up appointment set up for August 3rd @ 10:00AM in IM clinic.   Diet: Renal diet, Dehaas beef or pork products  DVT prophylaxis: SCDs  Dispo: Anticipated discharge on Thursday, 7/23    The patient does have a current PCP ([name], MD) and does need an [facility] hospital follow-up appointment after discharge.  The patient does not have transportation limitations that hinder transportation to clinic appointments.  .Services Needed at time of discharge: Y = Yes, Blank = Wechter PT:   OT:   RN:   Equipment:   Other:     LOS: 11 days   [name], MD 10/07/2013, 9:36 AM

## 2013-10-07 NOTE — Progress Notes (Signed)
10/07/2013 4:33 PM Hemodialysis Outpatient Note; I am awaiting admission status update from the [facility]. I was informed that the Medical Director will not be in to review the paperwork until tomorrow. I will follow up with the center tomorrow. Thank you. Gordy Savers

## 2013-10-07 NOTE — Progress Notes (Signed)
  PROGRESS NOTE MEDICINE TEACHING ATTENDING   Day 77 of stay Patient name: Keith Vaughan   Medical record number: [id number] Date of birth: October 31, 1965     I met with patient today and evaluated him. Goga new complaints, speaks burmese, little english. Awaiting placement. I have reviewed Dr Rivet's note and agree with her plan. I have discussed the care of this patient with my IM team residents. Please see the resident note for details.  Reeves, Bicknell 10/07/2013, 11:57 AM.

## 2013-10-07 NOTE — Progress Notes (Signed)
Patient was seen on dialysis and the procedure was supervised.   BFR 400 Via right IJ TC  BP is 150/84.  AP 180  VP 160 UF 4700 Bath 2K/2Ca  Patient appears to be tolerating treatment well; Tomkins adjustments to be made.  He still does not have an outpatient spot available for dialysis. Left Cimino fistula with a strong thrill.  Otelia Santee, MD 10/07/2013, 1:27 PM

## 2013-10-08 DIAGNOSIS — I10 Essential (primary) hypertension: Secondary | ICD-10-CM

## 2013-10-08 MED ORDER — DIPHENHYDRAMINE HCL 25 MG PO CAPS: 25.0000 mg | ORAL_CAPSULE | Freq: Four times a day (QID) | ORAL | Status: DC | PRN

## 2013-10-08 NOTE — Progress Notes (Signed)
  [facility] Progress Note    Assessment/ Plan:   1. End-stage renal disease: Pt does NOT have outpt chair at [facility].  -- We are looking at other HD units. we should have a better idea by in the next 24-48hrs --> still [place] luck.  -- Pt s/p L RC AVF and R [facility] 10/01/13 w/ VVS --> appreciate help by VVS (beautiful fistula).  -- For HD Thursday on TTS schedule.  2. Hyperkalemia: Corrected with hemodialysis, continue renal diet.  3. Hypertension: Stable; mildly elevated but ok for now. Challenge EDW as tolerated and may be able to titrate down on antihypertensives.  4. Anemia of chronic kidney disease: Iron stores appear adequate, on ESA qweekly  5. Thrombocytopenia: Stable  6. Metabolic bone disease: Cont lanthanum for now; Ca high. PTH only 65.1 (goal 150-300).   Subjective:   Doing well with Lund complaints. Just waiting for a dialysis chair.    Objective:   BP 145/86  Pulse 76  Temp(Src) 98.4 F (36.9 C) (Oral)  Resp 18  Ht 5\' 2"  (1.575 m)  Wt 49.2 kg (108 lb 7.5 oz)  BMI 19.83 kg/m2  SpO2 96%  Intake/Output Summary (Last 24 hours) at 10/08/13 1556 Last data filed at 10/08/13 0900  Gross per 24 hour  Intake    600 ml  Output      0 ml  Net    600 ml   Weight change:   Physical Exam: Gen: Comfortably sitting up in bed  CVS: Pulse regular in rate and rhythm, S1 and S2 normal. Right IJ temporary dialysis catheter  Left Cimino soft, good augmentation and strong thrill  Resp: Good auscultation bilaterally, Deziel rales/rhonchi  Abd: Soft, flat, nontender  Ext: Shovlin lower extremity edema   Imaging: Schermer results found.  Labs: BMET  Recent Labs Lab 10/04/13 1550  NA 137  K 5.0  CL 92*  CO2 24  GLUCOSE 95  BUN 58*  CREATININE 9.85*  CALCIUM 10.5  PHOS 7.2*   CBC  Recent Labs Lab 10/04/13 1550  WBC 7.8  HGB 9.0*  HCT 26.7*  MCV 87.5  PLT 129*    Medications:    . amLODipine  10 mg Oral Daily  . darbepoetin (ARANESP) injection - DIALYSIS  60  mcg Intravenous Q Sat-HD  . heparin subcutaneous  5,000 Units Subcutaneous 3 times per day  . isoniazid  300 mg Oral Daily  . lanthanum  500 mg Oral TID WC  . lisinopril  10 mg Oral Daily  . mupirocin cream   Topical BID  . vitamin B-6  50 mg Oral Daily  . sodium chloride  3 mL Intravenous Q12H      Otelia Santee, MD 10/08/2013, 3:56 PM

## 2013-10-08 NOTE — Progress Notes (Signed)
10/08/2013 4:45 PM Hemodialysis Outpatient Note; this patient has been accepted at the [facility] on a Tuesday, Thursday and Saturday 2nd shift schedule. The center will begin treatment on Saturday July 25 only if the patient can visit the center on Friday July 24 at 10:00 to sign paperwork. Patient MUST bring interpreter with him for assistance when signing paperwork. Thank you. Gordy Savers

## 2013-10-08 NOTE — Progress Notes (Signed)
Subjective: Patient does not have any complaints this AM. Still waiting on outpatient HD bed.  Objective: Vital signs in last 24 hours: Filed Vitals:   10/07/13 1535 10/07/13 1613 10/07/13 2100 10/08/13 0500  BP: 148/89 138/86 123/82 134/82  Pulse: 78 76 105 72  Temp: 97.2 F (36.2 C) 98 F (36.7 C) 98.5 F (36.9 C) 98.3 F (36.8 C)  TempSrc: Oral Oral Oral Oral  Resp: 18 18 18 16   Height:      Weight: 108 lb 7.5 oz (49.2 kg)     SpO2: 100% 100% 97% 95%   Weight change:   Intake/Output Summary (Last 24 hours) at 10/08/13 0816 Last data filed at 10/08/13 [id number]  Gross per 24 hour  Intake    720 ml  Output   4200 ml  Net  -3480 ml   Physical Exam General: alert, cooperative, converses in Burmese, sitting up in bed HEENT: [place]/AT, EOMI, PERRL, mucus membranes moist  Neck: Supple, Luther JVD. Right IJ catheter in place, covered by clean bandage  CV: RRR, normal S1/S2, Furuta m/g/r  Pulm: CTA bilaterally, Fessenden wheezes, Trawick cough  Ext: warm, moves all, Benassi edema. Small lipoma located on outside of left ankle, Goodwyn tenderness. Left radiocephalic fistula on forearm, thrill present.  Msk: normal ROM, Cornette edema  Neuro: alert, oriented to person, place, and time. Pascua CN deficits. Strength intact.  Skin: Tattoos on left forearm and both arms.   Lab Results: Basic Metabolic Panel:  Recent Labs Lab 10/04/13 1550  NA 137  K 5.0  CL 92*  CO2 24  GLUCOSE 95  BUN 58*  CREATININE 9.85*  CALCIUM 10.5  PHOS 7.2*   Liver Function Tests:  Recent Labs Lab 10/04/13 1550  ALBUMIN 3.6   Goforth results found for this basename: LIPASE, AMYLASE,  in the last 168 hours Cazarez results found for this basename: AMMONIA,  in the last 168 hours CBC:  Recent Labs Lab 10/04/13 1550  WBC 7.8  HGB 9.0*  HCT 26.7*  MCV 87.5  PLT 129*   Cardiac Enzymes: Wilbon results found for this basename: CKTOTAL, CKMB, CKMBINDEX, TROPONINI,  in the last 168 hours BNP: Mullins results found for this basename: PROBNP,  in the  last 168 hours D-Dimer: Ching results found for this basename: DDIMER,  in the last 168 hours CBG: Harbeson results found for this basename: GLUCAP,  in the last 168 hours Hemoglobin A1C: Thunder results found for this basename: HGBA1C,  in the last 168 hours Fasting Lipid Panel: Soutar results found for this basename: CHOL, HDL, LDLCALC, TRIG, CHOLHDL, LDLDIRECT,  in the last 168 hours Thyroid Function Tests: Kehl results found for this basename: TSH, T4TOTAL, FREET4, T3FREE, THYROIDAB,  in the last 168 hours Coagulation: Freitas results found for this basename: LABPROT, INR,  in the last 168 hours Anemia Panel: Mullet results found for this basename: VITAMINB12, FOLATE, FERRITIN, TIBC, IRON, RETICCTPCT,  in the last 168 hours Urine Drug Screen: Drugs of Abuse  Rasheed results found for this basename: labopia,  cocainscrnur,  labbenz,  amphetmu,  thcu,  labbarb    Alcohol Level: Stonerock results found for this basename: ETH,  in the last 168 hours Urinalysis: Mccarver results found for this basename: COLORURINE, APPERANCEUR, LABSPEC, PHURINE, GLUCOSEU, HGBUR, BILIRUBINUR, KETONESUR, PROTEINUR, UROBILINOGEN, NITRITE, LEUKOCYTESUR,  in the last 168 hours   Micro Results: Recent Results (from the past 240 hour(s))  SURGICAL PCR SCREEN     Status: None   Collection Time  10/01/13  7:05 AM      Result Value Ref Range Status   MRSA, PCR NEGATIVE  NEGATIVE Final   Staphylococcus aureus NEGATIVE  NEGATIVE Final   Comment:            The Xpert SA Assay (FDA     approved for NASAL specimens     in patients over 18 years of age),     is one component of     a comprehensive surveillance     program.  Test performance has     been validated by [employer] for patients greater     than or equal to 35 year old.     It is not intended     to diagnose infection nor to     guide or monitor treatment.   Studies/Results: Boyan results found. Medications: I have reviewed the patient's current medications. Scheduled Meds: .  amLODipine  10 mg Oral Daily  . darbepoetin (ARANESP) injection - DIALYSIS  60 mcg Intravenous Q Sat-HD  . heparin subcutaneous  5,000 Units Subcutaneous 3 times per day  . isoniazid  300 mg Oral Daily  . lanthanum  500 mg Oral TID WC  . lisinopril  10 mg Oral Daily  . mupirocin cream   Topical BID  . vitamin B-6  50 mg Oral Daily  . sodium chloride  3 mL Intravenous Q12H   Continuous Infusions:  PRN Meds:.acetaminophen, acetaminophen, hydrALAZINE, oxyCODONE Assessment/Plan: Mr. Macpherson is a 48yo man, refugee from [place], [place] speaker, with PMH of ESRD on HD 3 times weekly and HTN presenting to establish care in [place].   1. ESRD on HD (TTS): The patient is a refugee from [place]. Per medical records from [place], his first HD was in December 2014 at [facility] likely due to uncontrolled hypertension. His vascular access has been Right IJ which was placed in December 2014. Last HD screening labs in 6/30 drawn at [facility] with HIV 1/2 non reactive, hep B antigen nonreactive, hep B antibody >1000.0 mIU/ml, anti-HCV non reactive (all these repeat labs have been negative). CXR from March 5th 2015 with "right IJ cath in-situ with borderline cardiomegaly". CXR here shows cardiomegaly, with Ciani focal region of consolidation nor focal infiltrates, interstitial findings consistent with pulmonary edema.  - Nephrology consulted, appreciate help with inpatient HD and setting up outpatient HD  - Renal diet--pt prefers rice and vegetables and Rhue beef or pork products  - Vascular Surgery consulted for AVF placement (vein mapping completed 09/27/13). Patient underwent left radial-cephalic AV fistula placement on 10/02/13. Pt to follow up in 4-6 weeks with Dr. Kellie Simmering.  - PPD positive, 15x60mm, red, raised. Patient asymptomatic. Quantiferon positive. 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 Discussed with Dr. Tommy Medal from ID and he recommended  Isoniazid for 9 months. There is Harkins need for negative pressure or respiratory isolation at this time.   2. HTN: BP as high as 200/116 on admission before HD but is now normal. His usual antihypertensive regimen include amlodipine 10mg  daily and enalapril 10mg  daily.  - Continue Amlodipine 10mg  PO daily and Lisinopril 10mg  PO daily  - Hydralazine 5mg  PRN for SPB >180   3. Anemia of chronic disease: Last Hg on 7/6 at 6.6>>8.9>>9.5>>9.0 on 7/18 , per [place] medical report it appears that he had been receiving Erythropoietin 6000 U once per week since Jun 22 and received 2 units of  pRBC during his last HD session on July 9th. Iron panel shows Fe 103, TIBC 190, Ferritin 938 which is consistent with anemia of chronic disease as expected.  - Continue to monitor   4. Thrombocytopenia - Chronic issue for him. Platelets of 91>>101>>124>>129 on 10/04/13. Cui bleeding noted.  - Continue to monitor   5. Refugee status - [place] signs of active TB. PPD+, neg CXR, +quantiferon, started on Isoniazid. IOM contact is Leanora Ivanoff [phone]. Dr. Hayes Ludwig spoke with her and she states that the patient already has Medicaid application in process as well as housing, food, and other community help. She will be in contact with the [facility] social worker and case managers. Prior to his departure from [place], the patient has been deemed independent of his ADLs and will likely not need ALF or SNF placement. This was confirmed with PT/OT, patient can complete all ADLs independently.  - Social work/Care management consulted for placement/housing assistance. Talked with patient, Lanae Boast ([place] rep) and case manager, [place], on 7/17 and CLIP process has begun now that address established. Nephrology (Dr. Joelyn Oms) will be setting up outpatient HD--> should have better idea of HD bed placement in next 24-48 hours  - Patient has PCP follow up appointment set up for August 3rd @ 10:00AM in IM clinic.   Diet: Renal  diet, Mcginnity beef or pork products  DVT prophylaxis: SCDs  Dispo: Anticipated discharge on Thursday, 7/23    The patient does have a current PCP ([name], MD) and does need an [facility] hospital follow-up appointment after discharge.  The patient does not have transportation limitations that hinder transportation to clinic appointments.  .Services Needed at time of discharge: Y = Yes, Blank = [place]enter PT:   OT:   RN:   Equipment:   Other:     LOS: 12 days   [name], MD 10/08/2013, 8:16 AM

## 2013-10-08 NOTE — Progress Notes (Signed)
Late Entry 10/07/13: Called and spoke with Dr. Jimmy Footman regarding the process for getting [name] set up at [facility]. Stated that I needed to speak with Jolene regarding the financial information for patient. Called and spoke with Henrine Screws, [place] manager. Stated that patient needed to have either active emergency medicaid with number or a letter of guarantee for payment from [facility] prior to patient being assigned at any [place] center. Jolene suggested that [employer], SW at [facility] HD center be contacted for further information regarding financial status.   Notified SW Crawford Givens and Jasmine Pang, Nurse Care Manager of the above information.  Keith Vaughan, Keith Vaughan

## 2013-10-08 NOTE — Progress Notes (Signed)
10/08/2013 3:17 PM Hemodialysis Outpatient Update; [place] schedule as yet however Aleene Davidson [facility] financial counselor is attempting to contact the [employer] to obtain the pending medicaid number. Will update as soon as I receive the information. Thank you. Gordy Savers

## 2013-10-09 LAB — RENAL FUNCTION PANEL
Albumin: 3.4 g/dL — ABNORMAL LOW (ref 3.5–5.2)
Anion gap: 19 — ABNORMAL HIGH (ref 5–15)
BUN: 68 mg/dL — AB (ref 6–23)
CO2: 24 meq/L (ref 19–32)
Calcium: 10.5 mg/dL (ref 8.4–10.5)
Chloride: 96 mEq/L (ref 96–112)
Creatinine, Ser: 9.58 mg/dL — ABNORMAL HIGH (ref 0.50–1.35)
GFR calc Af Amer: 6 mL/min — ABNORMAL LOW (ref >90)
GFR, EST NON AFRICAN AMERICAN: 6 mL/min — AB (ref >90)
Glucose, Bld: 105 mg/dL — ABNORMAL HIGH (ref 70–99)
Phosphorus: 8.2 mg/dL — ABNORMAL HIGH (ref 2.3–4.6)
Potassium: 5.2 mEq/L (ref 3.7–5.3)
Sodium: 139 mEq/L (ref 137–147)

## 2013-10-09 LAB — CBC
HCT: 27.9 % — ABNORMAL LOW (ref 39.0–52.0)
Hemoglobin: 9.3 g/dL — ABNORMAL LOW (ref 13.0–17.0)
MCH: 29.2 pg (ref 26.0–34.0)
MCHC: 33.3 g/dL (ref 30.0–36.0)
MCV: 87.5 fL (ref 78.0–100.0)
PLATELETS: 92 10*3/uL — AB (ref 150–400)
RBC: 3.19 MIL/uL — ABNORMAL LOW (ref 4.22–5.81)
RDW: 15.7 % — ABNORMAL HIGH (ref 11.5–15.5)
WBC: 10.5 10*3/uL (ref 4.0–10.5)

## 2013-10-09 MED ORDER — HEPARIN SODIUM (PORCINE) 1000 UNIT/ML DIALYSIS: 1000.0000 [IU] | INTRAMUSCULAR | Status: DC | PRN

## 2013-10-09 MED ORDER — LIDOCAINE-PRILOCAINE 2.5-2.5 % EX CREA: 1.0000 "application " | TOPICAL_CREAM | CUTANEOUS | Status: DC | PRN

## 2013-10-09 MED ORDER — LISINOPRIL 10 MG PO TABS: 10.0000 mg | ORAL_TABLET | Freq: Every day | ORAL | Status: DC

## 2013-10-09 MED ORDER — PENTAFLUOROPROP-TETRAFLUOROETH EX AERO: 1.0000 "application " | INHALATION_SPRAY | CUTANEOUS | Status: DC | PRN

## 2013-10-09 MED ORDER — ISONIAZID 300 MG PO TABS: 300.0000 mg | ORAL_TABLET | Freq: Every day | ORAL | Status: DC

## 2013-10-09 MED ORDER — NEPRO/CARBSTEADY PO LIQD
237.0000 mL | ORAL | Status: DC | PRN
  Filled 2013-10-09: qty 237

## 2013-10-09 MED ORDER — PYRIDOXINE HCL 50 MG PO TABS: 50.0000 mg | ORAL_TABLET | Freq: Every day | ORAL | Status: DC

## 2013-10-09 MED ORDER — SODIUM CHLORIDE 0.9 % IV SOLN: 100.0000 mL | INTRAVENOUS | Status: DC | PRN

## 2013-10-09 MED ORDER — ALTEPLASE 2 MG IJ SOLR
2.0000 mg | Freq: Once | INTRAMUSCULAR | Status: DC | PRN
  Filled 2013-10-09: qty 2

## 2013-10-09 MED ORDER — LIDOCAINE HCL (PF) 1 % IJ SOLN: 5.0000 mL | INTRAMUSCULAR | Status: DC | PRN

## 2013-10-09 NOTE — Progress Notes (Signed)
  [facility] Progress Note    Assessment/ Plan:   1. End-stage renal disease: Pt does NOT have outpt chair at [facility].  -- We are looking at other HD units  --> still Cow[place] luck.  -- Pt s/p L RC AVF and R [facility] 10/01/13 w/ VVS --> appreciate help by VVS (beautiful fistula).  -- Seen on HD today on TTS schedule.  2. Hyperkalemia: Corrected with hemodialysis, continue renal diet.  3. Hypertension: Stable; mildly elevated but ok for now. Challenge EDW as tolerated and may be able to titrate down on antihypertensives.  4. Anemia of chronic kidney disease: Iron stores appear adequate, on ESA qweekly  5. Thrombocytopenia: Stable  6. Metabolic bone disease: Cont lanthanum for now; Ca high. PTH only 65.1 (goal 150-300).   Subjective:   Doing well with Donze complaints. Just waiting for a dialysis chair.    Objective:   BP 110/82  Pulse 82  Temp(Src) 98 F (36.7 C) (Oral)  Resp 18  Ht 5\' 2"  (1.575 m)  Wt 53.2 kg (117 lb 4.6 oz)  BMI 21.45 kg/m2  SpO2 98%  Intake/Output Summary (Last 24 hours) at 10/09/13 0858 Last data filed at 10/08/13 1700  Gross per 24 hour  Intake    360 ml  Output      0 ml  Net    360 ml   Weight change: -1.066 kg (-2 lb 5.6 oz)  Physical Exam: Gen: Comfortably sitting up in bed  CVS: Pulse regular in rate and rhythm, S1 and S2 normal. Right IJ temporary dialysis catheter  Left Cimino soft, good augmentation and strong thrill  Resp: Good auscultation bilaterally, Dipierro rales/rhonchi  Abd: Soft, flat, nontender  Ext: Lunde lower extremity edema   Imaging: Stracener results found.  Labs: BMET  Recent Labs Lab 10/04/13 1550 10/09/13 0730  NA 137 139  K 5.0 5.2  CL 92* 96  CO2 24 24  GLUCOSE 95 105*  BUN 58* 68*  CREATININE 9.85* 9.58*  CALCIUM 10.5 10.5  PHOS 7.2* 8.2*   CBC  Recent Labs Lab 10/04/13 1550 10/09/13 0730  WBC 7.8 10.5  HGB 9.0* 9.3*  HCT 26.7* 27.9*  MCV 87.5 87.5  PLT 129* PENDING    Medications:    . amLODipine   10 mg Oral Daily  . darbepoetin (ARANESP) injection - DIALYSIS  60 mcg Intravenous Q Sat-HD  . heparin subcutaneous  5,000 Units Subcutaneous 3 times per day  . isoniazid  300 mg Oral Daily  . lanthanum  500 mg Oral TID WC  . lisinopril  10 mg Oral Daily  . mupirocin cream   Topical BID  . vitamin B-6  50 mg Oral Daily  . sodium chloride  3 mL Intravenous Q12H      Otelia Santee, MD 10/09/2013, 8:58 AM

## 2013-10-09 NOTE — Care Management Note (Signed)
Updates by Renal PA, Shelton Silvas that [facility] has now agreed to accept this pt and the pt may be d/c. This CM again placed calls to [place] @ [phone]. Friends in the pt room have offered to take the pt home, however I have asked that this pt be d/c with someone from the organization so that d/c instructions may be given to them.   [place] letter has been completed for this pt to obtain prescriptions for $3 each.  Will email the organization to give specific info re time and dates for HD.  Jasmine Pang RN MPH, case manager, [phone]

## 2013-10-09 NOTE — Progress Notes (Signed)
Subjective: Patient has [place] complaints today. I am very happy to say that patient finally was accepted at [facility] on TTS schedule. He will begin dialysis on Saturday July 25th ONLY if he can visit center on Friday July 24th at 10:00 AM to sign paperwork.   Objective: Vital signs in last 24 hours: Filed Vitals:   10/09/13 0901 10/09/13 0930 10/09/13 1000 10/09/13 1030  BP: 118/77 127/86 123/84 107/70  Pulse: 84 89 89 95  Temp:      TempSrc:      Resp:      Height:      Weight:      SpO2:       Weight change: -2 lb 5.6 oz (-1.066 kg)  Intake/Output Summary (Last 24 hours) at 10/09/13 1035 Last data filed at 10/08/13 1700  Gross per 24 hour  Intake    240 ml  Output      0 ml  Net    240 ml   Physical Exam General: alert, cooperative, converses in Burmese, in HD bed  HEENT: [place]/AT, EOMI, PERRL, mucus membranes moist  Neck: Supple, Villa JVD. Right IJ catheter in place, covered by clean bandage  CV: RRR, normal S1/S2, Phenix m/g/r  Pulm: CTA bilaterally, Yagi wheezes, Harpster cough  Ext: warm, moves all, Cammarata edema. Small lipoma located on outside of left ankle, Malhotra tenderness. Left radiocephalic fistula on forearm, thrill present.  Msk: normal ROM, Fronczak edema  Neuro: alert, oriented to person, place, and time. Ditter CN deficits. Strength intact.  Skin: Tattoos on left forearm and both arms.     Lab Results: Basic Metabolic Panel:  Recent Labs Lab 10/04/13 1550 10/09/13 0730  NA 137 139  K 5.0 5.2  CL 92* 96  CO2 24 24  GLUCOSE 95 105*  BUN 58* 68*  CREATININE 9.85* 9.58*  CALCIUM 10.5 10.5  PHOS 7.2* 8.2*   Liver Function Tests:  Recent Labs Lab 10/04/13 1550 10/09/13 0730  ALBUMIN 3.6 3.4*   Popper results found for this basename: LIPASE, AMYLASE,  in the last 168 hours Mears results found for this basename: AMMONIA,  in the last 168 hours CBC:  Recent Labs Lab 10/04/13 1550 10/09/13 0730  WBC 7.8 10.5  HGB 9.0* 9.3*  HCT 26.7* 27.9*  MCV 87.5 87.5  PLT  129* 92*   Cardiac Enzymes: Garber results found for this basename: CKTOTAL, CKMB, CKMBINDEX, TROPONINI,  in the last 168 hours BNP: Monnin results found for this basename: PROBNP,  in the last 168 hours D-Dimer: Guardia results found for this basename: DDIMER,  in the last 168 hours CBG: Plath results found for this basename: GLUCAP,  in the last 168 hours Hemoglobin A1C: Dotzler results found for this basename: HGBA1C,  in the last 168 hours Fasting Lipid Panel: Mckowen results found for this basename: CHOL, HDL, LDLCALC, TRIG, CHOLHDL, LDLDIRECT,  in the last 168 hours Thyroid Function Tests: Conteh results found for this basename: TSH, T4TOTAL, FREET4, T3FREE, THYROIDAB,  in the last 168 hours Coagulation: Garlington results found for this basename: LABPROT, INR,  in the last 168 hours Anemia Panel: Claassen results found for this basename: VITAMINB12, FOLATE, FERRITIN, TIBC, IRON, RETICCTPCT,  in the last 168 hours Urine Drug Screen: Drugs of Abuse  Shareef results found for this basename: labopia, cocainscrnur, labbenz, amphetmu, thcu, labbarb    Alcohol Level: Budzynski results found for this basename: ETH,  in the last 168 hours Urinalysis: Boedecker results found for this basename:  COLORURINE, APPERANCEUR, LABSPEC, [place], GLUCOSEU, HGBUR, BILIRUBINUR, KETONESUR, PROTEINUR, UROBILINOGEN, NITRITE, LEUKOCYTESUR,  in the last 168 hours   Micro Results: Recent Results (from the past 240 hour(s))  SURGICAL PCR SCREEN     Status: None   Collection Time    10/01/13  7:05 AM      Result Value Ref Range Status   MRSA, PCR NEGATIVE  NEGATIVE Final   Staphylococcus aureus NEGATIVE  NEGATIVE Final   Comment:            The Xpert SA Assay (FDA     approved for NASAL specimens     in patients over 18 years of age),     is one component of     a comprehensive surveillance     program.  Test performance has     been validated by [employer] for patients greater     than or equal to 34 year old.     It is not intended     to  diagnose infection nor to     guide or monitor treatment.   Studies/Results: Godinho results found. Medications: I have reviewed the patient's current medications. Scheduled Meds: . amLODipine  10 mg Oral Daily  . darbepoetin (ARANESP) injection - DIALYSIS  60 mcg Intravenous Q Sat-HD  . heparin subcutaneous  5,000 Units Subcutaneous 3 times per day  . isoniazid  300 mg Oral Daily  . lanthanum  500 mg Oral TID WC  . lisinopril  10 mg Oral Daily  . mupirocin cream   Topical BID  . vitamin B-6  50 mg Oral Daily  . sodium chloride  3 mL Intravenous Q12H   Continuous Infusions:  PRN Meds:.sodium chloride, sodium chloride, acetaminophen, acetaminophen, alteplase, diphenhydrAMINE, feeding supplement (NEPRO CARB STEADY), heparin, heparin, hydrALAZINE, lidocaine (PF), lidocaine-prilocaine, oxyCODONE, pentafluoroprop-tetrafluoroeth Assessment/Plan: Mr. Keith Vaughan is a 48yo man, refugee from [place], [place] speaker, with PMH of ESRD on HD 3 times weekly and HTN presenting to establish care in [place].   1. ESRD on HD (TTS): The patient is a refugee from [place]. Per medical records from [place], his first HD was in December 2014 at [facility] likely due to uncontrolled hypertension. His vascular access has been Right IJ which was placed in December 2014. Last HD screening labs in 6/30 drawn at [facility] with HIV 1/2 non reactive, hep B antigen nonreactive, hep B antibody >1000.0 mIU/ml, anti-HCV non reactive (all these repeat labs have been negative). CXR from March 5th 2015 with "right IJ cath in-situ with borderline cardiomegaly". CXR here shows cardiomegaly, with Albergo focal region of consolidation nor focal infiltrates, interstitial findings consistent with pulmonary edema.  - Nephrology consulted, appreciate help with inpatient HD and setting up outpatient HD  - Renal diet--pt prefers rice and vegetables and Marcom beef or pork products  - Vascular Surgery consulted for AVF  placement (vein mapping completed 09/27/13). Patient underwent left radial-cephalic AV fistula placement on 10/02/13. Pt to follow up in 4-6 weeks with Dr. Kellie Simmering.  - PPD positive, 15x62mm, red, raised. Patient asymptomatic. Quantiferon positive. 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 Discussed with Dr. Tommy Medal from ID and he recommended Isoniazid for 9 months. There is Aytes need for negative pressure or respiratory isolation at this time.   2. HTN: BP as high as 200/116 on admission before HD but is now normal. His usual antihypertensive regimen include amlodipine 10mg  daily and enalapril  10mg  daily.  - Continue Amlodipine 10mg  PO daily and Lisinopril 10mg  PO daily  - Hydralazine 5mg  PRN for SPB >180   3. Anemia of chronic disease: Last Hg on 7/6 at 6.6>>8.9>>9.5>>9.0 on 7/18 , per [place] medical report it appears that he had been receiving Erythropoietin 6000 U once per week since Jun 22 and received 2 units of pRBC during his last HD session on July 9th. Iron panel shows Fe 103, TIBC 190, Ferritin 938 which is consistent with anemia of chronic disease as expected.  - Continue to monitor   4. Thrombocytopenia - Chronic issue for him. Platelets of 91>>101>>124>>129 on 10/04/13. Burandt bleeding noted.  - Continue to monitor   5. Refugee status - [place] signs of active TB. PPD+, neg CXR, +quantiferon, started on Isoniazid. IOM contact is Leanora Ivanoff [phone]. Dr. Hayes Ludwig spoke with her and she states that the patient already has Medicaid application in process as well as housing, food, and other community help. She will be in contact with the [facility] social worker and case managers. Prior to his departure from [place], the patient has been deemed independent of his ADLs and will likely not need ALF or SNF placement. This was confirmed with PT/OT, patient can complete all ADLs independently.  - Social work/Care management consulted for placement/housing assistance. Talked with  patient, Lanae Boast ([employer] rep) and case manager, [place], on 7/17 and CLIP process has begun now that address established. Nephrology (Dr. Joelyn Oms) will be setting up outpatient HD--> Patient accepted at [facility]. First dialysis on July 25th as long as makes it to center tomorrow July 24th to sign paperwork. - Patient has PCP follow up appointment set up for August 3rd @ 10:00AM in IM clinic.   Diet: Renal diet, Cienfuegos beef or pork products  DVT prophylaxis: SCDs  Dispo: Patient to be discharged today. He has HD bed at [facility], starting dialysis on July 25th.    The patient does have a current PCP ([name], MD) and does need an [facility] hospital follow-up appointment after discharge.  The patient does not have transportation limitations that hinder transportation to clinic appointments.  .Services Needed at time of discharge: Y = Yes, Blank = [place] PT:   OT:   RN:   Equipment:   Other:     LOS: 13 days   [name], MD 10/09/2013, 10:35 AM

## 2013-10-09 NOTE — Progress Notes (Signed)
Updated by nephrology that patient lost bed at [facility] due to pending Medicaid number. They are working on getting him another HD bed. Will f/u with nephrology and social work.   [name], MD

## 2013-10-10 NOTE — Discharge Summary (Signed)
Name: Keith Vaughan MRN: [id number] DOB: 01-20-1966 48 y.o. PCP: J[facility] Pcp Per Patient  Date of Admission: 09/26/2013  4:26 AM Date of Discharge: 10/09/13 Attending Physician: Michaelis att. providers found  Discharge Diagnosis: 1. ESRD on HD (TTS) 2. Latent Tuberculosis 3. HTN 4. Anemia of Chronic Disease 5. Thrombocytopenia   Discharge Medications:   Medication List    STOP taking these medications       enalapril 10 MG tablet  Commonly known as:  VASOTEC      TAKE these medications       amLODipine 10 MG tablet  Commonly known as:  NORVASC  Take 10 mg by mouth 2 (two) times daily.     b complex vitamins tablet  Take 1 tablet by mouth daily.     Calcium Carbonate 500 (200 CA) MG Wafr  Take 500 mg by mouth daily.     FERROUS FUMARATE PO  Take 1 tablet by mouth 2 (two) times daily. "200mg "     furosemide 40 MG tablet  Commonly known as:  LASIX  Take 40 mg by mouth 3 (three) times daily.     IBERET-FOLIC-500 PO  Take 1 tablet by mouth daily.     isoniazid 300 MG tablet  Commonly known as:  NYDRAZID  Take 1 tablet (300 mg total) by mouth daily.     lisinopril 10 MG tablet  Commonly known as:  PRINIVIL,ZESTRIL  Take 1 tablet (10 mg total) by mouth daily.     NON FORMULARY  Take 500 mg by mouth daily. "Progesic"     prazosin 1 MG capsule  Commonly known as:  MINIPRESS  Take 1 mg by mouth 2 (two) times daily.     pyridOXINE 50 MG tablet  Commonly known as:  B-6  Take 1 tablet (50 mg total) by mouth daily.        Disposition and follow-up:   [name] was discharged from [facility] in Good condition.  At the hospital follow up visit please address:  1.  Hemodialysis. Compliance with medications (Isoniazid).   2.  Labs / imaging needed at time of follow-up: CBC, BMP  3.  Pending labs/ test needing follow-up: None  Follow-up Appointments:     Follow-up Information   Follow up with [name], MD In 6 weeks. (Office will call you to  arrange your appt (sent))    Specialty:  Vascular Surgery   Contact information:   [place] [address] [phone]       Follow up with [name], MD. (August 3rd @ 10:00AM)    Specialty:  Internal Medicine   Contact information:   [place] [address] [phone]       Discharge Instructions: Discharge Instructions   Diet - low sodium heart healthy    Complete by:  As directed      Increase activity slowly    Complete by:  As directed            Consultations: Treatment Team:  [name]. [name], MD  Procedures Performed:  Dg Chest 2 View  09/26/2013   CLINICAL DATA:  Eval for HD cath  EXAM: CHEST  2 VIEW  COMPARISON:  None.  FINDINGS: Low lung volumes. Cardiac silhouette is enlarged. A right internal jugular sheath is appreciated tip superior vena cava. Adamcik pneumothorax. There is diffuse prominence of interstitial markings and central peribronchial cuffing. Severt focal region of consolidation nor focal infiltrates. Gamm acute  osseous abnormalities.  IMPRESSION: Interstitial findings consistent with pulmonary edema.  Petrilla focal regions of consolidation.  Oshana pneumothorax.  Cardiomegaly.   Electronically Signed   By: [name] M.D.   On: 09/26/2013 11:31   US Abdomen Complete  09/26/2013   CLINICAL DATA:  Eval RUQ, history of end-stage renal disease receiving dialysis  EXAM: ULTRASOUND ABDOMEN COMPLETE  COMPARISON:  None.  FINDINGS: Gallbladder:  Lehner gallstones. Gallbladder wall is slightly prominent measuring 3.3 mm in thickness. Avetisyan pericholecystic fluid nor a sonographic Murphy sign.  Common bile duct:  Diameter: 4.4 mm  Liver:  Kley focal lesion identified. Within normal limits in parenchymal echogenicity.  IVC:  Mehan abnormality visualized.  Pancreas:  Visualized portion unremarkable.  Spleen:  Size and appearance within normal limits.  Right Kidney:  Length: 7.6 cm. Decreased corticomedullary differentiation and diffuse increased cortical echogenicity. Mountjoy  hydronephrosis. Behrendt gross evidence of solid masses.  Left Kidney:  Length: 6.26 cm. Decreased corticomedullary differentiation, diffuse increased cortical echogenicity and Noffke evidence of hydronephrosis. Tatem gross evidence of solid masses.  Abdominal aorta:  Halloran aneurysm visualized.  Other findings:  None.  IMPRESSION: Gallbladder wall prominence without further sonographic evidence of cholecystitis. Correlation with biliary function tests recommended if clinically warranted.  Bilateral renal atrophy with sonographic findings consistent with patient's history of end-stage renal disease.   Electronically Signed   By: [name] M.D.   On: 09/26/2013 12:13   Dg Chest Port 1 View  10/01/2013   CLINICAL DATA:  Diabetic catheter insertion.  EXAM: PORTABLE CHEST - 1 VIEW  COMPARISON:  09/26/2013  FINDINGS: Double lumen catheter has been inserted and the tips appear in good position in the superior vena cava and cavoatrial junction. Dewberry pneumothorax. Pulmonary vascularity is normal and the lungs are clear. Abend osseous abnormality. Slight cardiomegaly.  IMPRESSION: Diatek catheter appears in good position.  Lampi pneumothorax.   Electronically Signed   By: Rozetta Nunnery M.D.   On: 10/01/2013 12:17   Dg Fluoro Guide Cv Line-Horiuchi Report  10/01/2013   CLINICAL DATA: dialysis catheter placement   FLOURO GUIDE CV LINE  Fluoroscopy was utilized by the requesting physician.  Snooks radiographic  interpretation.     Admission HPI: Mr. Keith Vaughan is a 48 year old man, Burmese and [place] speaker, with PMH of HTN, ESRD on HD 3 times per week, who immigrated on 09/25/13 from [place] via [place] where he had been a refugee since 2006. The information in this H&P comes from interview with him with a Burmese phone interpreter, from medical chart sent from [place], and from the ED provider. He has ESRD suspected to be secondary to hypertension and has been on dialysis since December 2014. Last night he was brought to the [facility] from the airport  via EMS shortly after arriving in the [place]. He was last dialyzed on 7/9 in [place] and received 2 units of pRBC at that time. Per the ED notes, it appears that this organization is willing to help support Mr. Laba while he receives dialysis.   He was bradycardic in the ED but is asymptomatic. He complained of right neck pain which he attributed to neck strain from sitting in the airplane. He has paternal relatives here in [place] and wishes to establish permanent residence here but he is not planning to live with his relatives.   In reviewing his records from [place], he has been evaluated as recent as July 6th and was found to have Hgb of 6.6 at that  time. He was evaluated by Dr. Juanda Crumble from the [employer] and was deemed independent of his ADLs and fit to travel on July 9th.   Dr. Jimmy Footman, in Nephrology was contacted by the ED physician and recommended that the patient be admitted to the IMTS for dialysis, placement of permanent HD access, and possible placement in ALF.    Hospital Course by problem list:   1. ESRD on HD (TTS): The patient is a refugee from [place]. Per medical records from [place], his first HD was in December 2014 at [facility] likely due to uncontrolled hypertension. His vascular access has been Right IJ which was placed in December 2014. Last HD screening labs in 6/30 drawn at [facility] with HIV 1/2 non reactive, hep B antigen nonreactive, hep B antibody >1000.0 mIU/ml, anti-HCV non reactive (all these repeat labs have been negative). CXR from March 5th 2015 with "right IJ cath in-situ with borderline cardiomegaly". CXR here on 09/26/13 shows cardiomegaly, with Burgert focal region of consolidation nor focal infiltrates, interstitial findings consistent with pulmonary edema. Nephrology was consulted and helped with patient's inpatient HD and setting up outpatient HD. He was placed on a renal diet. Vascular surgery was  consulted on 7/10 for AVF placement. Vein mapping was completed on 09/27/13. Patient underwent left radial-cephalic AV fistula placement on 10/02/13. AVF working beautifully. Pt to follow up in 4-6 weeks with Dr. Kellie Simmering.    2. Latent Tuberculosis: Pt had PPD placed on 7/10. PPD was positive, 15x91mm, red, raised. Patient asymptomatic. Quantiferon positive. PPD likely positive from patient's hx of BCG vaccine. CXR on 7/10 negative. Results consistent with latent TB. Discussed with Dr. Tommy Medal from ID and he recommended Isoniazid for 9 months. There was Mcintyre need for negative pressure or respiratory isolation at this time. Patient prescribed Isoniazid on 09/30/13, and to be continued on discharge for 9 months (end date 07/01/13).   3. HTN: BP as high as 200/116 on admission before HD but is now normal. His usual antihypertensive regimen include amlodipine 10mg  daily and enalapril 10mg  daily. Pt was placed on Amlodipine 10 mg PO daily and started on Lisinopril 10 mg PO daily. His enalapril was discontinued. He received Hydralazine 5 mg PRN for SBP > 180. He will continue Lisinopril and Amlodipine on discharge.   4. Anemia of Chronic Disease: Stable. Hg trend 6.6>>8.9>>9.5>>9.0>>9.3 on 7/23 , per [place] medical report it appears that he had been receiving Erythropoietin 6000 U once per week since Jun 22 and received 2 units of pRBC during his last HD session on July 9th. Iron panel shows Fe 103, TIBC 190, Ferritin 938 which is consistent with anemia of chronic disease as expected.   5. Thrombocytopenia: Chronic issue for him. Platelets of 91>>101>>124>>129>>92 on 10/09/13. Motsinger bleeding noted. This was monitored throughout his hospitalization.  Discharge Vitals:   BP 115/79  Pulse 83  Temp(Src) 97.5 F (36.4 C) (Oral)  Resp 17  Ht 5\' 2"  (1.575 m)  Wt 109 lb 12.6 oz (49.8 kg)  BMI 20.08 kg/m2  SpO2 97% Physical Exam  General: alert, cooperative, converses in Burmese, in HD bed  HEENT: [place]/AT, EOMI, PERRL,  mucus membranes moist  Neck: Supple, Kampf JVD. Right IJ catheter in place, covered by clean bandage  CV: RRR, normal S1/S2, Eggleton m/g/r  Pulm: CTA bilaterally, Scarola wheezes, Yadav cough  Ext: warm, moves all, Fluty edema. Small lipoma located on outside of left ankle, Caratachea tenderness. Left radiocephalic fistula on  forearm, thrill present.  Msk: normal ROM, Muhlbauer edema  Neuro: alert, oriented to person, place, and time. Wale CN deficits. Strength intact.  Skin: Tattoos on left forearm and both arms.   Discharge Labs:  Fresquez results found for this or any previous visit (from the past 24 hour(s)).  Signed: [name], MD 10/10/2013, 12:18 PM    Services Ordered on Discharge: None Equipment Ordered on Discharge: None

## 2013-10-13 NOTE — Discharge Summary (Signed)
INTERNAL MEDICINE ATTENDING DISCHARGE COSIGN   I discussed the discharge plan with my resident team. I agree with the discharge documentation and disposition.   Madilyn Fireman 10/13/2013, 2:56 PM

## 2013-10-20 ENCOUNTER — Ambulatory Visit: Payer: Self-pay | Admitting: Internal Medicine

## 2013-11-15 ENCOUNTER — Emergency Department (HOSPITAL_COMMUNITY)
Admission: EM | Admit: 2013-11-15 | Discharge: 2013-11-15 | Disposition: A | Discharge status: Home / Self Care | Payer: Medicaid Other | Attending: Emergency Medicine | Admitting: Emergency Medicine

## 2013-11-15 ENCOUNTER — Encounter (HOSPITAL_COMMUNITY): Payer: Self-pay | Admitting: Emergency Medicine

## 2013-11-15 ENCOUNTER — Emergency Department (HOSPITAL_COMMUNITY): Payer: Medicaid Other

## 2013-11-15 DIAGNOSIS — Z992 Dependence on renal dialysis: Secondary | ICD-10-CM | POA: Insufficient documentation

## 2013-11-15 DIAGNOSIS — Z8669 Personal history of other diseases of the nervous system and sense organs: Secondary | ICD-10-CM | POA: Diagnosis not present

## 2013-11-15 DIAGNOSIS — Z87891 Personal history of nicotine dependence: Secondary | ICD-10-CM | POA: Insufficient documentation

## 2013-11-15 DIAGNOSIS — Z79899 Other long term (current) drug therapy: Secondary | ICD-10-CM | POA: Diagnosis not present

## 2013-11-15 DIAGNOSIS — D649 Anemia, unspecified: Secondary | ICD-10-CM | POA: Insufficient documentation

## 2013-11-15 DIAGNOSIS — R079 Chest pain, unspecified: Secondary | ICD-10-CM | POA: Diagnosis not present

## 2013-11-15 DIAGNOSIS — N186 End stage renal disease: Secondary | ICD-10-CM | POA: Diagnosis not present

## 2013-11-15 DIAGNOSIS — Z8611 Personal history of tuberculosis: Secondary | ICD-10-CM | POA: Diagnosis not present

## 2013-11-15 DIAGNOSIS — R0789 Other chest pain: Secondary | ICD-10-CM | POA: Diagnosis not present

## 2013-11-15 DIAGNOSIS — I120 Hypertensive chronic kidney disease with stage 5 chronic kidney disease or end stage renal disease: Secondary | ICD-10-CM | POA: Insufficient documentation

## 2013-11-15 DIAGNOSIS — R0602 Shortness of breath: Secondary | ICD-10-CM | POA: Diagnosis not present

## 2013-11-15 LAB — I-STAT CHEM 8, ED
BUN: 17 mg/dL (ref 6–23)
CALCIUM ION: 1.12 mmol/L (ref 1.12–1.23)
CREATININE: 5.8 mg/dL — AB (ref 0.50–1.35)
Chloride: 94 mEq/L — ABNORMAL LOW (ref 96–112)
Glucose, Bld: 91 mg/dL (ref 70–99)
HCT: 46 % (ref 39.0–52.0)
Hemoglobin: 15.6 g/dL (ref 13.0–17.0)
Potassium: 4.6 mEq/L (ref 3.7–5.3)
Sodium: 133 mEq/L — ABNORMAL LOW (ref 137–147)
TCO2: 33 mmol/L (ref 0–100)

## 2013-11-15 LAB — CBC WITH DIFFERENTIAL/PLATELET
Basophils Absolute: 0.1 10*3/uL (ref 0.0–0.1)
Basophils Relative: 1 % (ref 0–1)
Eosinophils Absolute: 0.3 10*3/uL (ref 0.0–0.7)
Eosinophils Relative: 4 % (ref 0–5)
HEMATOCRIT: 41.2 % (ref 39.0–52.0)
HEMOGLOBIN: 14 g/dL (ref 13.0–17.0)
Lymphocytes Relative: 20 % (ref 12–46)
Lymphs Abs: 1.5 10*3/uL (ref 0.7–4.0)
MCH: 30 pg (ref 26.0–34.0)
MCHC: 34 g/dL (ref 30.0–36.0)
MCV: 88.2 fL (ref 78.0–100.0)
MONO ABS: 0.8 10*3/uL (ref 0.1–1.0)
MONOS PCT: 11 % (ref 3–12)
NEUTROS ABS: 4.9 10*3/uL (ref 1.7–7.7)
Neutrophils Relative %: 64 % (ref 43–77)
Platelets: 42 10*3/uL — ABNORMAL LOW (ref 150–400)
RBC: 4.67 MIL/uL (ref 4.22–5.81)
RDW: 14.4 % (ref 11.5–15.5)
WBC: 7.6 10*3/uL (ref 4.0–10.5)

## 2013-11-15 LAB — I-STAT TROPONIN, ED
Troponin i, poc: 0.01 ng/mL (ref 0.00–0.08)
Troponin i, poc: 0.01 ng/mL (ref 0.00–0.08)

## 2013-11-15 NOTE — ED Notes (Signed)
Patient returned from X-ray 

## 2013-11-15 NOTE — ED Notes (Signed)
Need translator phone before assessment; patient shrugs shoulders and shakes head Reine to all attempts to communicate or gestures. Points to faces scale of "2" face.

## 2013-11-15 NOTE — Discharge Instructions (Signed)
Continue all regular medications at home. Continue dialysis. Please follow up with your doctor early next week. Return if worsening symptoms.    Chest Pain (Nonspecific) It is often hard to give a specific diagnosis for the cause of chest pain. There is always a chance that your pain could be related to something serious, such as a heart attack or a blood clot in the lungs. You need to follow up with your health care provider for further evaluation. CAUSES   Heartburn.  Pneumonia or bronchitis.  Anxiety or stress.  Inflammation around your heart (pericarditis) or lung (pleuritis or pleurisy).  A blood clot in the lung.  A collapsed lung (pneumothorax). It can develop suddenly on its own (spontaneous pneumothorax) or from trauma to the chest.  Shingles infection (herpes zoster virus). The chest wall is composed of bones, muscles, and cartilage. Any of these can be the source of the pain.  The bones can be bruised by injury.  The muscles or cartilage can be strained by coughing or overwork.  The cartilage can be affected by inflammation and become sore (costochondritis). DIAGNOSIS  Lab tests or other studies may be needed to find the cause of your pain. Your health care provider may have you take a test called an ambulatory electrocardiogram (ECG). An ECG records your heartbeat patterns over a 24-hour period. You may also have other tests, such as:  Transthoracic echocardiogram (TTE). During echocardiography, sound waves are used to evaluate how blood flows through your heart.  Transesophageal echocardiogram (TEE).  Cardiac monitoring. This allows your health care provider to monitor your heart rate and rhythm in real time.  Holter monitor. This is a portable device that records your heartbeat and can help diagnose heart arrhythmias. It allows your health care provider to track your heart activity for several days, if needed.  Stress tests by exercise or by giving medicine that  makes the heart beat faster. TREATMENT   Treatment depends on what may be causing your chest pain. Treatment may include:  Acid blockers for heartburn.  Anti-inflammatory medicine.  Pain medicine for inflammatory conditions.  Antibiotics if an infection is present.  You may be advised to change lifestyle habits. This includes stopping smoking and avoiding alcohol, caffeine, and chocolate.  You may be advised to keep your head raised (elevated) when sleeping. This reduces the chance of acid going backward from your stomach into your esophagus. Most of the time, nonspecific chest pain will improve within 2-3 days with rest and mild pain medicine.  HOME CARE INSTRUCTIONS   If antibiotics were prescribed, take them as directed. Finish them even if you start to feel better.  For the next few days, avoid physical activities that bring on chest pain. Continue physical activities as directed.  Do not use any tobacco products, including cigarettes, chewing tobacco, or electronic cigarettes.  Avoid drinking alcohol.  Only take medicine as directed by your health care provider.  Follow your health care provider's suggestions for further testing if your chest pain does not go away.  Keep any follow-up appointments you made. If you do not go to an appointment, you could develop lasting (chronic) problems with pain. If there is any problem keeping an appointment, call to reschedule. SEEK MEDICAL CARE IF:   Your chest pain does not go away, even after treatment.  You have a rash with blisters on your chest.  You have a fever. SEEK IMMEDIATE MEDICAL CARE IF:   You have increased chest pain or pain that  spreads to your arm, neck, jaw, back, or abdomen.  You have shortness of breath.  You have an increasing cough, or you cough up blood.  You have severe back or abdominal pain.  You feel nauseous or vomit.  You have severe weakness.  You faint.  You have chills. This is an  emergency. Do not wait to see if the pain will go away. Get medical help at once. Call your local emergency services (911 in [place] Do not drive yourself to the hospital. MAKE SURE YOU:   Understand these instructions.  Will watch your condition.  Will get help right away if you are not doing well or get worse. Document Released: 12/14/2004 Document Revised: 03/11/2013 Document Reviewed: 10/10/2007 [facility] Patient Information 2015 [place], [place]. This information is not intended to replace advice given to you by your health care provider. Make sure you discuss any questions you have with your health care provider.

## 2013-11-15 NOTE — ED Provider Notes (Signed)
CSN: [id number]     Arrival date & time 11/15/13  35 History   First MD Initiated Contact with Patient 11/15/13 1605     Chief Complaint  Patient presents with  . Chest Pain     (Consider location/radiation/quality/duration/timing/severity/associated sxs/prior Treatment) HPI Keith Vaughan is a 48 y.o. male, with hx of anemia, ESRD, hearing loss, HTN,  who presents to ED with complaint of palpitations and SOB while in dialysis. Pt with ESRD, presumably due to HTN, has been on dialysis since Dec 2014. He is a refugee from [place], has been in [place] for 2 months. Was hospitalized last month for initiation of dialysis, and placement of left extremity fistula. Pt is followed by nephrology. States that today, he went for his regular dialysis and states during it he had episode of SOB and palpitations. States it has happened in the past. He attributes it to "having fluid pulled off too fast" and states "they had me sitting, instead of lying down." Pt denies any symptoms at this time.   Interpreter phone used, pt is Burmese speaking only. Can hear better on left ear  Past Medical History  Diagnosis Date  . Dialysis patient   . Hypertension   . Anemia   . S/P hemodialysis catheter insertion     Right Internal Jugular   . Hearing loss   . Renal disorder   . ESRF (end stage renal failure)   . ESRD on dialysis 09/30/2013  . 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 Tuberculosis 10/05/2013    Latent; CXR negative; asymptomatic;    Past Surgical History  Procedure Laterality Date  . Lipoma excision Left 2014    left thigh lipoma  . Av fistula placement Left 10/01/2013    Procedure: RADIOCEPHALIC ARTERIOVENOUS (AV) FISTULA CREATION LEFT ARM;  Surgeon: Mal Misty, MD;  Location: [facility];  Service: Vascular;  Laterality: Left;  . Insertion of dialysis catheter Right 10/01/2013    Procedure: INSERTION OF DIALYSIS CATHETER- Right Internal Jugular;  Surgeon: Mal Misty, MD;  Location: [facility];  Service: Vascular;   Laterality: Right;   History reviewed. Maler pertinent family history. History  Substance Use Topics  . Smoking status: Former Research scientist (life sciences)  . Smokeless tobacco: Never Used  . Alcohol Use: Denson    Review of Systems  Constitutional: Negative for fever and chills.  Respiratory: Positive for chest tightness and shortness of breath. Negative for cough.   Cardiovascular: Positive for chest pain. Negative for palpitations and leg swelling.  Gastrointestinal: Negative for nausea, vomiting, abdominal pain, diarrhea and abdominal distention.  Genitourinary: Negative for dysuria, urgency, frequency and hematuria.  Musculoskeletal: Negative for arthralgias, myalgias, neck pain and neck stiffness.  Skin: Negative for rash.  Allergic/Immunologic: Negative for immunocompromised state.  Neurological: Negative for dizziness, weakness, light-headedness, numbness and headaches.  All other systems reviewed and are negative.     Allergies  Review of patient's allergies indicates D[place]itt known allergies.  Home Medications   Prior to Admission medications   Medication Sig Start Date End Date Taking? Authorizing Provider  amLODipine (NORVASC) 10 MG tablet Take 10 mg by mouth 2 (two) times daily.     Historical Provider, MD  b complex vitamins tablet Take 1 tablet by mouth daily.     Historical Provider, MD  Calcium Carbonate 500 (200 CA) MG WAFR Take 500 mg by mouth daily.     Historical Provider, MD  FERROUS FUMARATE PO Take 1 tablet by mouth 2 (two) times daily. "200mg "  Historical Provider, MD  furosemide (LASIX) 40 MG tablet Take 40 mg by mouth 3 (three) times daily.     Historical Provider, MD  Iron-Vitamins (IBERET-FOLIC-500 PO) Take 1 tablet by mouth daily.     Historical Provider, MD  isoniazid (NYDRAZID) 300 MG tablet Take 1 tablet (300 mg total) by mouth daily. 10/09/13   [name], MD  lisinopril (PRINIVIL,ZESTRIL) 10 MG tablet Take 1 tablet (10 mg total) by mouth daily. 10/09/13   [name], MD  NON  FORMULARY Take 500 mg by mouth daily. "Progesic"    Historical Provider, MD  prazosin (MINIPRESS) 1 MG capsule Take 1 mg by mouth 2 (two) times daily.     Historical Provider, MD  pyridOXINE (B-6) 50 MG tablet Take 1 tablet (50 mg total) by mouth daily. 10/09/13   Carly Rivet, MD   BP 132/82  Pulse 79  Temp(Src) 98 F (36.7 C) (Oral)  Resp 16  Ht 5\' 2"  (1.575 m)  Wt 110 lb (49.896 kg)  BMI 20.11 kg/m2  SpO2 100% Physical Exam  Nursing note and vitals reviewed. Constitutional: He is oriented to person, place, and time. He appears well-developed and well-nourished. Mihelich distress.  HENT:  Head: Normocephalic and atraumatic.  Eyes: Conjunctivae are normal.  Neck: Neck supple.  Cardiovascular: Normal rate, regular rhythm and normal heart sounds.   Pulmonary/Chest: Effort normal. Mccollom respiratory distress. He has Cohron wheezes. He has Hynes rales.  Abdominal: Soft. Bowel sounds are normal. He exhibits Ghanem distension. There is Rubalcava tenderness. There is Pacholski rebound.  Musculoskeletal: He exhibits Pierron edema.  Neurological: He is alert and oriented to person, place, and time.  Skin: Skin is warm and dry.    ED Course  Procedures (including critical care time) Labs Review Labs Reviewed  I-STAT CHEM 8, ED - Abnormal; Notable for the following:    Sodium 133 (*)    Chloride 94 (*)    Creatinine, Ser 5.80 (*)    All other components within normal limits  CBC WITH DIFFERENTIAL  Randolm Idol, ED    Imaging Review Dg Chest 2 View  11/15/2013   CLINICAL DATA:  Chest pain.  Tachycardia.  EXAM: CHEST  2 VIEW  COMPARISON:  10/01/2013.  FINDINGS: Dual lumen central line noted in stable anatomic position. Mediastinum and hilar structures normal. Lungs are clear. Heart size normal. Normal pulmonary vascularity. Munyan pleural effusion or pneumothorax. Anagnos acute bony abnormality. Nipple shadows again noted. Radiopacity left upper quadrant, this may represent contrast within bowel.  IMPRESSION: 1. Dual lumen central  line in good anatomic position. 2. Asfour acute cardiopulmonary disease.   Electronically Signed   By: Marcello Moores  Register   On: 11/15/2013 18:09     EKG Interpretation   Date/Time:  Saturday November 15 2013 15:34:17 EDT Ventricular Rate:  74 PR Interval:  154 QRS Duration: 95 QT Interval:  430 QTC Calculation: 477 R Axis:   74 Text Interpretation:  Sinus rhythm Left ventricular hypertrophy  Nonspecific T abnrm, anterolateral leads ST elev, probable normal early  repol pattern Borderline prolonged QT interval Confirmed by Debby Freiberg [id number]) on 11/15/2013 3:48:04 PM      MDM   Final diagnoses:  Chest tightness or pressure    Pt with palpitations and CP while in HD. He is currently asymptomatic. Hx of htn, otherwise Cronk cardiac problems. Will get labs, CXR, monitor. Currently normal VS.   8:16 PM Patient continues to chest pain and symptom-free. His labs are at baseline, continues to  have thrombocytopenia, and elevated creatinine. troponins x2, 3 hours apart are negative. EKG repeated, unchanged. Patient's event is atypical for possible coronary artery disease. At this time her vital signs are normal. Will discharge home with close outpatient followup. Discussed results and the importance of following up using interpreter phone and the patient voiced understanding  Filed Vitals:   11/15/13 1600 11/15/13 1630 11/15/13 1840 11/15/13 1915  BP: 116/82 126/88 150/104 123/99  Pulse: 80 77 82 73  Temp:      TempSrc:      Resp: 14 19 19 20   Height:      Weight:      SpO2: 100% 100% 98% 100%     Florene Route Cynde Menard, PA-C 11/15/13 2018

## 2013-11-15 NOTE — ED Notes (Signed)
Pt speaks Skorupski English; at dialysis, finished dialysis, pulling off 2600cc; EMS reports during dialysis became tachycardic up to 160's, not sustained, then patient reported chest pain.

## 2013-11-15 NOTE — ED Notes (Signed)
Patient transported to X-ray 

## 2013-11-17 ENCOUNTER — Encounter: Payer: Self-pay | Admitting: Vascular Surgery

## 2013-11-18 ENCOUNTER — Other Ambulatory Visit (HOSPITAL_COMMUNITY): Payer: Self-pay

## 2013-11-18 ENCOUNTER — Encounter: Payer: Self-pay | Admitting: Vascular Surgery

## 2013-11-18 NOTE — ED Provider Notes (Signed)
Medical screening examination/treatment/procedure(s) were performed by non-physician practitioner and as supervising physician I was immediately available for consultation/collaboration.   EKG Interpretation   Date/Time:  Saturday November 15 2013 18:59:57 EDT Ventricular Rate:  67 PR Interval:  174 QRS Duration: 93 QT Interval:  435 QTC Calculation: 459 R Axis:   65 Text Interpretation:  Sinus rhythm Left ventricular hypertrophy ST elev,  probable normal early repol pattern ED PHYSICIAN INTERPRETATION AVAILABLE  IN [facility] Confirmed by TEST, Record ([id number]) on 11/17/2013 7:51:04  AM       Virgel Manifold, MD 11/18/13 1553

## 2013-12-22 ENCOUNTER — Telehealth: Payer: Self-pay | Admitting: General Practice

## 2013-12-22 NOTE — Telephone Encounter (Signed)
Case mgr from ? ( could not understand what she was saying? Would like to talk to supervisor about being a new pt. Says he has been rejected several times Please advise

## 2013-12-26 NOTE — Telephone Encounter (Signed)
Cyndi Lennert (case worker from [employer]) checking on new patient appt.  Patient is here from [place] since July 2015, but originally from [place].  Patient has Medicaid and [facility] is listed as PCP.  Kennyth Lose (new patient coordinator) not in office this week.  Gave caseworker a new patient packet to complete and mail or drop off at our office.  Will forward message to Kennyth Lose to schedule appt with a provider or [facility]).  May be able to schedule an appt on 01/05/14.  Will call caseworker next week with appt status.  Will also check with Dr. Mingo Amber for approval.  Caseworker verbalized understanding and was agreeable/appreciative of plan.  Burna Forts, BSN, RN-BC

## 2013-12-30 NOTE — Telephone Encounter (Signed)
This sounds good with me.  I would like to work with [employer] more anyway.  Please let me know what I can do to help.

## 2014-01-05 NOTE — Telephone Encounter (Signed)
Caseworker here with patient today and thought he had an appt scheduled with [facility].  Informed caseworker that she was supposed to complete new patient packet and drop off or mail to our office.  We would schedule appt after receiving info.  New patient info placed in Dr. Cindra Presume box and appt scheduled for 01/19/14 at 2:00 pm.  Request for Burmese interpreter noted in [place] and also emailed request to interpreter services.  Burna Forts, BSN, RN-BC

## 2014-01-19 ENCOUNTER — Ambulatory Visit (INDEPENDENT_AMBULATORY_CARE_PROVIDER_SITE_OTHER): Payer: Medicaid Other | Admitting: Family Medicine

## 2014-01-19 ENCOUNTER — Ambulatory Visit (INDEPENDENT_AMBULATORY_CARE_PROVIDER_SITE_OTHER): Payer: Medicaid Other | Admitting: *Deleted

## 2014-01-19 VITALS — BP 143/93 | HR 90 | Temp 98.3°F | Ht 65.5 in | Wt 123.1 lb

## 2014-01-19 DIAGNOSIS — Z23 Encounter for immunization: Secondary | ICD-10-CM

## 2014-01-19 DIAGNOSIS — Z992 Dependence on renal dialysis: Secondary | ICD-10-CM

## 2014-01-19 DIAGNOSIS — A159 Respiratory tuberculosis unspecified: Secondary | ICD-10-CM

## 2014-01-19 DIAGNOSIS — A150 Tuberculosis of lung: Secondary | ICD-10-CM

## 2014-01-19 DIAGNOSIS — N186 End stage renal disease: Secondary | ICD-10-CM

## 2014-01-19 DIAGNOSIS — H9191 Unspecified hearing loss, right ear: Secondary | ICD-10-CM

## 2014-01-19 DIAGNOSIS — Z0289 Encounter for other administrative examinations: Secondary | ICD-10-CM | POA: Insufficient documentation

## 2014-01-19 DIAGNOSIS — Z008 Encounter for other general examination: Secondary | ICD-10-CM

## 2014-01-19 DIAGNOSIS — Z789 Other specified health status: Secondary | ICD-10-CM
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
## 2014-01-19 NOTE — Patient Instructions (Signed)
It was very nice to meet you today.  [facility] is now your [facility] and is where you should return for examinations or illnesses.  You were given you flu shot and pneumonia vaccination today. We will need to see you back in 6 weeks for a follow up visit.

## 2014-01-21 NOTE — Assessment & Plan Note (Signed)
Followed by [facility]

## 2014-01-21 NOTE — Assessment & Plan Note (Signed)
Long-standing.   Desire ENT referral.  Likely more helpful to see an audiologist.  Will refer today.

## 2014-01-21 NOTE — Assessment & Plan Note (Signed)
Followed here by [facility] on Tues-Thur-Sat

## 2014-03-05 ENCOUNTER — Telehealth: Payer: Self-pay | Admitting: *Deleted

## 2014-03-05 NOTE — Telephone Encounter (Signed)
Message left on my voicemail yesterday--Per Cyndi Lennert (caseworker from [employer])--Patient will be starting school in January and needs referral for a hearing aid since he can't hear.   Returned call to caseworker for additional info.  Left message to call me back.  Burna Forts, BSN, RN-BC

## 2014-06-12 ENCOUNTER — Other Ambulatory Visit: Payer: Self-pay | Admitting: Nephrology

## 2014-06-12 DIAGNOSIS — N62 Hypertrophy of breast: Secondary | ICD-10-CM

## 2014-06-17 ENCOUNTER — Other Ambulatory Visit: Payer: Medicaid Other

## 2014-06-22 ENCOUNTER — Ambulatory Visit
Admission: RE | Admit: 2014-06-22 | Discharge: 2014-06-22 | Disposition: A | Discharge status: Home / Self Care | Payer: Medicaid Other | Source: Ambulatory Visit | Attending: Nephrology | Admitting: Nephrology

## 2014-06-22 DIAGNOSIS — N62 Hypertrophy of breast: Secondary | ICD-10-CM

## 2014-07-03 ENCOUNTER — Ambulatory Visit: Payer: Medicaid Other | Admitting: Family Medicine

## 2015-01-27 DIAGNOSIS — N186 End stage renal disease: Secondary | ICD-10-CM

## 2015-01-28 NOTE — Congregational Nurse Program (Signed)
Congregational Nurse Program Note  Date of Encounter: 01/27/2015  Past Medical History: Past Medical History  Diagnosis Date  . Dialysis patient   . Hypertension   . Anemia   . S/P hemodialysis catheter insertion     Right Internal Jugular   . Hearing loss   . Renal disorder   . ESRF (end stage renal failure)   . ESRD on dialysis 09/30/2013  . 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 New   Patient is considered a/an Refugee   Patient Assistance   Patient's financial/insurance status Medicaid   Patient referred to apply for the following financial assistance Not Applicable   Food insecurities addressed Not Applicable   Transportation assistance Yes   Assistance securing medications [place]   Educational health offerings Nutrition   Encounter Details   Primary purpose of visit Chronic Illness/Condition Visit   Was an Emergency Department visit averted? Not Applicable   Does patient have a medical provider? Yes   Patient referred to Nutritionist   Was a mental health screening completed? (GAINS tool) Jerrett   Does patient have dental issues? Odle   Since previous encounter, have you referred patient for abnormal blood pressure that resulted in a new diagnosis or medication change? Neumeister   Since previous encounter, have you referred patient for abnormal blood glucose that resulted in a new diagnosis or medication change? Bachmeier      Office visit in [place]. Referral for support and referral for transportation assistance. Currently receiving Renal Dialysis at [facility] on Tuesday, Thursday and Saturday. Transportation to center on [place] bus. Experiencing financial hardships due to limited resources and lack of family support. Plan: Refer to NAI SW, [place] for support and transportation; complete SCAT application  and request Theme park manager via [place].Refer to Dayna Ramus, [facility] nurse for evaluation and follow up. Return on 1l-15-16.

## 2015-02-05 ENCOUNTER — Encounter: Payer: Self-pay | Admitting: *Deleted

## 2015-02-05 DIAGNOSIS — N186 End stage renal disease: Secondary | ICD-10-CM

## 2015-02-05 NOTE — Congregational Nurse Program (Unsigned)
Congregational Nurse Program Note  Date of Encounter: 02/05/2015  Past Medical History: Past Medical History  Diagnosis Date  . Dialysis patient   . Hypertension   . Anemia   . S/P hemodialysis catheter insertion     Right Internal Jugular   . Hearing loss   . Renal disorder   . ESRF (end stage renal failure)   . ESRD on dialysis 09/30/2013  . 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 New   Patient is considered a/an Refugee   Patient Assistance   Patient's financial/insurance status Medicaid   Patient referred to apply for the following financial assistance Not Applicable   Food insecurities addressed Not Applicable   Transportation assistance Yes   Assistance securing medications [place]   Educational health offerings Nutrition   Encounter Details   Primary purpose of visit Chronic Illness/Condition Visit   Was an Emergency Department visit averted? Not Applicable   Does patient have a medical provider? Yes   Patient referred to Nutritionist   Was a mental health screening completed? (GAINS tool) Barrientez   Does patient have dental issues? Dehaven   Since previous encounter, have you referred patient for abnormal blood pressure that resulted in a new diagnosis or medication change? Loyal   Since previous encounter, have you referred patient for abnormal blood glucose that resulted in a new diagnosis or medication change? Artois

## 2015-02-09 NOTE — Congregational Nurse Program (Signed)
Congregational Nurse Program Note  Date of Encounter: 02/05/2015  Past Medical History: Past Medical History  Diagnosis Date  . Dialysis patient   . Hypertension   . Anemia   . S/P hemodialysis catheter insertion     Right Internal Jugular   . Hearing loss   . Renal disorder   . ESRF (end stage renal failure)   . ESRD on dialysis 09/30/2013  . 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 New   Patient is considered a/an Refugee   Patient Assistance   Patient's financial/insurance status Medicaid   Patient referred to apply for the following financial assistance Not Applicable   Food insecurities addressed Not Applicable   Transportation assistance Yes   Assistance securing medications [place]   Educational health offerings Nutrition   Encounter Details   Primary purpose of visit Chronic Illness/Condition Visit   Was an Emergency Department visit averted? Not Applicable   Does patient have a medical provider? Yes   Patient referred to Nutritionist   Was a mental health screening completed? (GAINS tool) Iacobucci   Does patient have dental issues? Hochberg   Since previous encounter, have you referred patient for abnormal blood pressure that resulted in a new diagnosis or medication change? Raimondo   Since previous encounter, have you referred patient for abnormal blood glucose that resulted in a new diagnosis or medication change? Raz     Follow-up office visit at [facility]. Obtained Lisinopril on 11/18 and Sensipar and Renagel from [place] on 11/21 due to financial assistance from CN program. Continues to experience confusion regarding taking medication. BP 199/116; pulse 77. Dialysis moved to 12:30 today and unable to remain for further discussion on medication. Uses city bus transportation to dialysis.   Advised to consult with nurse at [facility] for clarification on Sensipar and Renagel. Unable to use Burmese interpreter due to [employer] dialect preferred language. Client's sister translated through use of Burmese interpreter line. Transportation assisted with bus tickets today. Notified by SCAT transportation that client's application for transport to dialysis approved. 24 hour telephone notice for transport required by calling [phone] Information also provided to  [place], [place] Intern, by SCAT office.   Plan: Follow-up medication dosage with sister's assistance 11/23; develop medication chart and schedule; refer to dialysis nurse for dialysis medication follow-up today. Return to [place] nurse office 11/29 at [place] with NAI, Chealy Sin on transportation assistance and Beth Ashworth regarding mental health evaluation and follow-up on 11/29.

## 2015-02-16 DIAGNOSIS — N186 End stage renal disease: Secondary | ICD-10-CM

## 2015-02-17 DIAGNOSIS — N186 End stage renal disease: Secondary | ICD-10-CM

## 2015-02-17 NOTE — Congregational Nurse Program (Signed)
Congregational Nurse Program Note  Date of Encounter: 02/16/2015  Past Medical History: Past Medical History  Diagnosis Date  . Dialysis patient   . Hypertension   . Anemia   . S/P hemodialysis catheter insertion     Right Internal Jugular   . Hearing loss   . Renal disorder   . ESRF (end stage renal failure)   . ESRD on dialysis 09/30/2013  . 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 New   Patient is considered a/an Refugee   Patient Assistance   Patient's financial/insurance status Medicaid   Patient referred to apply for the following financial assistance Not Applicable   Food insecurities addressed Not Applicable   Transportation assistance Yes   Assistance securing medications [place]   Educational health offerings Nutrition   Encounter Details   Primary purpose of visit Chronic Illness/Condition Visit   Was an Emergency Department visit averted? Not Applicable   Does patient have a medical provider? Yes   Patient referred to Nutritionist   Was a mental health screening completed? (GAINS tool) Filsinger   Does patient have dental issues? Brimley   Since previous encounter, have you referred patient for abnormal blood pressure that resulted in a new diagnosis or medication change? Girgenti   Since previous encounter, have you referred patient for abnormal blood glucose that resulted in a new diagnosis or medication change? Youman    Office visit at [facility] for blood pressure recheck. B/P 188/106 right arm. Dialysis shunt in left arm. Brought med, Lisinopril 40 mgm. Unsure about how to take medicine. Continues to experience multiple problems which includes housing and transportation. Presented letter of eviction if balance of $2,813.00 not paid to [address] [address] apartments by 02/17/15. Transportation approved by SCAT, but  language barrier prohibits scheduling weekly dialysis scheduling. Awaiting alternative DSS Medical transport possibly.  Plan: Utilize  Burmese interpreter(caution regarding hearing impaired). Assist contacting [place] office.Refer to [employer], NAI /SW. Refer to Nonie Hoyer, SW/[facility] for coordination of transportation to dialysis. Request  medication delivery  from [place] on 11/30 after 3 pm. Provide bus tickets (2) today.

## 2015-02-23 NOTE — Congregational Nurse Program (Signed)
Congregational Nurse Program Note  Date of Encounter: 02/17/2015  Past Medical History: Past Medical History  Diagnosis Date  . Dialysis patient ([place])   . Hypertension   . Anemia   . S/P hemodialysis catheter insertion ([place])     Right Internal Jugular   . Hearing loss   . Renal disorder   . ESRF (end stage renal failure) ([place])   . ESRD on dialysis ([place]) 09/30/2013  . 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 Existing   Patient is considered a/an Refugee   Patient Assistance   Patient's financial/insurance status Medicaid   Patient referred to apply for the following financial assistance Not Applicable   Food insecurities addressed Not Applicable   Transportation assistance Yes   Assistance securing medications Yes  PHARMACY CONTACTED   Educational health offerings Navigating the healthcare system;Hypertension  iNSTRUCTIONS TAKING MEDS   Encounter Details   Primary purpose of visit Education/Health Concerns;Chronic Illness/Condition Visit   Was an Emergency Department visit averted? Yes   Does patient have a medical provider? Yes   Patient referred to Follow up with established PCP   Was a mental health screening completed? (GAINS tool) Creps   Does patient have dental issues? Hemmelgarn   Was a dental referral made? Iannelli   Since previous encounter, have you referred patient for abnormal blood pressure that resulted in a new diagnosis or medication change? Yes  Asssistance ordering meds   Since previous encounter, have you referred patient for abnormal blood glucose that resulted in a new diagnosis or medication change? Batty   For Abstraction Use Only   Does patient have insurance? Goodnow

## 2015-02-23 NOTE — Congregational Nurse Program (Signed)
Follow- up visit scheduled at [facility] for B/P monitoring, review of medications, coordination of transportation to dialysis weekly, and referral to SW regarding housing crisis. Burmese interpreter used for dialogue. B/P 178/103. Pulse 77.Respirations 22. Reports taking Lisinopril 40 mgm at h.s. Unable to identify medications,  Sensipar and Renagel, delivered by [facility] on 12/02. Showed interest in B/P reading today and concern about recent problem with transportation to dialysis. Dressed neat and appropriate for weather. Provided copies of receipts verifying rent paid in full. Continues to reside at same address. Originally referred to [employer], [phone]) at [employer] for housing and eviction notice on 02/17/15. Expression very positive and pleasant today. Plan: Current housing issue resolved; update with Chealy Sin, SW/NA.; Contact Nonie Hoyer at [facility] to determine transportation dates and schedule (arrange pick-up from [facility] on Tuesday and Wednesday at 12 noon); review all meds on 12/07; review food selections and compare to diet information provided by nutritionist; purchase Ensure supplement for protein deficiency. Praise client for follow-up and continued participation in care.

## 2015-02-23 NOTE — Congregational Nurse Program (Signed)
Congregational Nurse Program Note  Date of Encounter: 02/17/2015  Past Medical History: Past Medical History  Diagnosis Date  . Dialysis patient ([place])   . Hypertension   . Anemia   . S/P hemodialysis catheter insertion ([place])     Right Internal Jugular   . Hearing loss   . Renal disorder   . ESRF (end stage renal failure) ([place])   . ESRD on dialysis ([place]) 09/30/2013  . 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 Existing   Patient is considered a/an Refugee   Patient Assistance   Patient's financial/insurance status Medicaid   Patient referred to apply for the following financial assistance Not Applicable   Food insecurities addressed Referred to food bank or resource   Transportation assistance Yes  Referred to [place] securing medications Yes  Coordinated delivery by pharmacy   Educational health offerings Hypertension;Medications;Nutrition   Encounter Details   Primary purpose of visit Chronic Illness/Condition Visit   Was an Emergency Department visit averted? Deveny   Does patient have a medical provider? Yes   Patient referred to Establish PCP   Was a mental health screening completed? (GAINS tool) Lempke   Does patient have dental issues? Yes   Was a dental referral made? Keeter   Since previous encounter, have you referred patient for abnormal blood pressure that resulted in a new diagnosis or medication change? Hufnagle   Since previous encounter, have you referred patient for abnormal blood glucose that resulted in a new diagnosis or medication change? Lewing   For Abstraction Use Only   Does patient have insurance? [place]

## 2015-03-02 NOTE — Congregational Nurse Program (Unsigned)
Congregational Nurse Program Note  Date of Encounter: 02/05/2015  Past Medical History: Past Medical History  Diagnosis Date  . Dialysis patient ([place])   . Hypertension   . Anemia   . S/P hemodialysis catheter insertion ([place])     Right Internal Jugular   . Hearing loss   . Renal disorder   . ESRF (end stage renal failure) ([place])   . ESRD on dialysis ([place]) 09/30/2013  . 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 B/P 205/115. Pulse 85. Respirations 20. Very concerned about elevated reading and engaged in conversation about condition via interpreter. Smoking decreased to two cigarettes  per day. Reported medications taken as prescribed. Concerned about transportation coordination to and from dialysis by Medical Transport/DSS.  Plan: [place] and refer concern about B/P today. Scheduled for dialysis at 12:30PM. Discuss transportation issue and schedule  with Nonie Hoyer, SW at [facility]. Discuss smoking cessation management with provider Recheck blood pressure on next visit to NAI.

## 2015-03-23 DIAGNOSIS — Z992 Dependence on renal dialysis: Principal | ICD-10-CM

## 2015-03-23 DIAGNOSIS — N186 End stage renal disease: Secondary | ICD-10-CM

## 2015-03-23 NOTE — Congregational Nurse Program (Signed)
Congregational Nurse Program Note  Date of Encounter: 03/23/2015  Past Medical History: Past Medical History  Diagnosis Date  . Dialysis patient ([place])   . Hypertension   . Anemia   . S/P hemodialysis catheter insertion ([place])     Right Internal Jugular   . Hearing loss   . Renal disorder   . ESRF (end stage renal failure) ([place])   . ESRD on dialysis ([place]) 09/30/2013  . 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 Tuberculosis 10/05/2013    Latent; CXR negative; asymptomatic;     Encounter Details:     CNP Questionnaire - 03/23/15 1237    Patient Demographics   Is this a new or existing patient? Existing   Patient is considered a/an Refugee   Race Asian   Patient Assistance   Location of Patient Assistance Not Applicable   Patient's financial/insurance status Medicaid   Uninsured Patient Age   Interventions Follow-up/Education/Support provided after completed appt.   Patient referred to apply for the following financial assistance Not Applicable   Food insecurities addressed Not Applicable   Transportation assistance [place]   Assistance securing medications S[place]   Educational health offerings Hypertension;Medications   Encounter Details   Primary purpose of visit Chronic Illness/Condition Visit   Was an Emergency Department visit averted? Not Applicable   Does patient have a medical provider? Yes   Patient referred to Not Applicable   Was a mental health screening completed? (GAINS tool) Bound   Does patient have dental issues? Gaal   Since previous encounter, have you referred patient for abnormal blood pressure that resulted in a new diagnosis or medication change? Yes   Since previous encounter, have you referred patient for abnormal blood glucose that resulted in a new diagnosis or medication change? Viegas   For Abstraction Use Only   Does patient have insurance? Kehm     Office visit at [facility] for blood pressure follow-up. Additional medication added since last visit to  manage hypertension. Unable to cite name of med; will bring med to nurse office on 01/03. Blood pressure 182/101. Pulse 78. In [place]ck[place] visible distress. Able to communicate in limited English. Expressed concern about pain at Dialysis Shunt site during infusion. Described interest in having shunt relocated in abdominal area. Left forearm shunt site very knotty in areas with multiple stick sites treatment.                                                                                                              Plan: Scheduled for transportation from current location at [place] today to Dialysis center. Message left for Nonie Hoyer, BSW at [facility] regarding concerns about Shunt. Patient to present all meds to nurse on 01/03 for review. Emphasized the need to reduce all sodium in food preparation.

## 2015-04-06 DIAGNOSIS — N186 End stage renal disease: Secondary | ICD-10-CM

## 2015-04-06 DIAGNOSIS — Z992 Dependence on renal dialysis: Principal | ICD-10-CM

## 2015-04-12 NOTE — Congregational Nurse Program (Unsigned)
Congregational Nurse Program Note  Date of Encounter: 04/07/2015  Past Medical History: Past Medical History  Diagnosis Date  . Dialysis patient ([place])   . Hypertension   . Anemia   . S/P hemodialysis catheter insertion ([place])     Right Internal Jugular   . Hearing loss   . Renal disorder   . ESRF (end stage renal failure) ([place])   . ESRD on dialysis ([place]) 09/30/2013  . 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 Existing

## 2015-04-23 NOTE — Congregational Nurse Program (Signed)
Congregational Nurse Program Note  Date of Encounter: 04/06/2015  Past Medical History: Past Medical History  Diagnosis Date  . Dialysis patient ([place])   . Hypertension   . Anemia   . S/P hemodialysis catheter insertion ([place])     Right Internal Jugular   . Hearing loss   . Renal disorder   . ESRF (end stage renal failure) ([place])   . ESRD on dialysis ([place]) 09/30/2013  . 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 Existing     Office visit to re-evaluate blood pressure.Very pleasant and responsive to dialogue. Hard of hearing and prefers to attempt limited English vs interpreter for simple instructions.  Readings continue elevated during Dialysis visits also. Confirms medication taken as advised. Today, B/P 160/100. Pulse 84. Plan: Stress diet of low salt foods, discuss importance of meds taken daily and same time. Develop written medication schedule for home. Return weekly for support and blood pressure follow-up.

## 2015-06-23 DIAGNOSIS — R0981 Nasal congestion: Secondary | ICD-10-CM

## 2015-06-23 DIAGNOSIS — Z992 Dependence on renal dialysis: Principal | ICD-10-CM

## 2015-06-23 DIAGNOSIS — I10 Essential (primary) hypertension: Secondary | ICD-10-CM

## 2015-06-23 DIAGNOSIS — N186 End stage renal disease: Secondary | ICD-10-CM

## 2015-06-24 NOTE — Congregational Nurse Program (Signed)
Congregational Nurse Program Note  Date of Encounter: 06/23/2015  Past Medical History: Past Medical History  Diagnosis Date  . Dialysis patient ([place])   . Hypertension   . Anemia   . S/P hemodialysis catheter insertion ([place])     Right Internal Jugular   . Hearing loss   . Renal disorder   . ESRF (end stage renal failure) ([place])   . ESRD on dialysis ([place]) 09/30/2013  . 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 Existing   Patient is considered a/an Refugee   Race Asian   Patient Assistance   Location of Patient Assistance Not Applicable   Patient's financial/insurance status Medicaid   Uninsured Patient [place]   Patient referred to apply for the following financial assistance Medicaid   Food insecurities addressed Not Applicable   Transportation assistance Yes   Type of Assistance Other   Assistance securing medications [place]   Educational health offerings Chronic disease   Encounter Details   Primary purpose of visit Chronic Illness/Condition Visit   Was an Emergency Department visit averted? Not Applicable   Was a mental health screening completed? (GAINS tool) Mirabile   Does patient have dental issues? Dantuono   Does patient have vision issues? Lewey   Since previous encounter, have you referred patient for abnormal blood pressure that resulted in a new diagnosis or medication change? Pompei   Since previous encounter, have you referred patient for abnormal blood glucose that resulted in a new diagnosis or medication change? Ernsberger     Office visit at [facility] today for this Burmese man after several weeks absence from classes. States that he did not come to school because he was very weak after dialysis treatment, "Unable to get out of bed". Felt much better after 3 hour dialysis; now feeling very bad after 4 hour dialysis.  Trying very hard to stop smoking and down to one pack per week. Blood pressure reading improved since last visits. B/p 141/92. Pulse 80.  Interested in reading today and pleased to see lower reading. Complain of pain in left shoulder area. Shunt site intact without drainage. Requested information on getting a kidney transplant. Failed to notify Transportation agency that he returned to school this week and needed transportation from [place], [place] on Tuesday and Thursday to dialysis at 12 noon; Saturday remains the same schedule from home. [facility] [employer]  Agency and dialysis center notified of change.  Dialysis appointment adjusted to accommodate client's late arrival today. Plan: Stressed need to communicate changes with agency or request assistance with friend to interpret. Return to office to recheck B/P on 4/5. Jannetta Quint, RN/CN

## 2015-07-14 DIAGNOSIS — N186 End stage renal disease: Secondary | ICD-10-CM

## 2015-07-14 DIAGNOSIS — Z992 Dependence on renal dialysis: Principal | ICD-10-CM

## 2015-07-14 DIAGNOSIS — I10 Essential (primary) hypertension: Secondary | ICD-10-CM

## 2015-07-14 NOTE — Congregational Nurse Program (Signed)
Congregational Nurse Program Note  Date of Encounter: 07/14/2015  Past Medical History: Past Medical History  Diagnosis Date  . Dialysis patient ([place])   . Hypertension   . Anemia   . S/P hemodialysis catheter insertion ([place])     Right Internal Jugular   . Hearing loss   . Renal disorder   . ESRF (end stage renal failure) ([place])   . ESRD on dialysis ([place]) 09/30/2013  . 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 Berger chest congestion or rales. Throat slightly reddened without lesions. B/p 113/73. Pulse 90. Burmese man able to communicate in basic [place] and make request known. Interpreter used for detailed information. Continues to get dialysis as scheduled with [place] further transportation problems. Currently living with sister now ([id number] Irregular eating schedule; Gardin morning meal today. Smokes one cigarette per day. Attempting to stop smoking.  Plan: Approval from nephrologist or nurse before taking decongestants or Benadryl for congestion. Drink warm tea or gargle for throat irritation. Eat morning meal before dialysis or school. Review daily fluid allowance. Monitor B/P decreasing. Collaborate with dialysis center nurse. Return 07/19/15 to recheck B/P. Jannetta Quint, RN/CN.

## 2015-07-21 DIAGNOSIS — I10 Essential (primary) hypertension: Secondary | ICD-10-CM

## 2015-07-21 DIAGNOSIS — Z992 Dependence on renal dialysis: Principal | ICD-10-CM

## 2015-07-21 DIAGNOSIS — N186 End stage renal disease: Secondary | ICD-10-CM

## 2015-07-21 NOTE — Congregational Nurse Program (Signed)
Congregational Nurse Program Note  Date of Encounter: 07/21/2015  Past Medical History: Past Medical History  Diagnosis Date  . Dialysis patient ([place])   . Hypertension   . Anemia   . S/P hemodialysis catheter insertion ([place])     Right Internal Jugular   . Hearing loss   . Renal disorder   . ESRF (end stage renal failure) ([place])   . ESRD on dialysis ([place]) 09/30/2013  . 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 Existing   Patient is considered a/an Refugee   Race Asian   Patient Assistance   Location of Patient Assistance Not Applicable   Patient's financial/insurance status Low Income;Medicaid   Uninsured Patient Keith Vaughan   Patient referred to apply for the following financial assistance Not Applicable   Food insecurities addressed Not Applicable   Transportation assistance [place]   Assistance securing medications [place]izz[place]   Educational health offerings Hypertension   Encounter Details   Primary purpose of visit Chronic Illness/Condition Visit   Was an Emergency Department visit averted? Not Applicable   Does patient have a medical provider? Yes   Patient referred to Not Applicable   Was a mental health screening completed? (GAINS tool) Woznick   Does patient have dental issues? Bowermaster   Does patient have vision issues? Tse   Since previous encounter, have you referred patient for abnormal blood pressure that resulted in a new diagnosis or medication change? [name]   Since previous encounter, have you referred patient for abnormal blood glucose that resulted in a new diagnosis or medication change? Rauls    07-20-15. Office visit at [employer] Requesting blood pressure check. Blood Pressure 122/80. Pulse 70. Very interested in monitoring it weekly with concern that it may elevate again. Reports feeling "ok". Dressed neat and smiling  upon reception to office. Lives with relative and reports feeling very happy about situation. Rad[place] problem with dialysis schedule. Transportation on schedule to dialysis without further problems. Plan: Continue to monitor additional salt intake. Follow fluid restriction as outlined by dialysis center; Chart weight after each treatment to compare. Return one week for B/P and review weight. Jannetta Quint, RN/CN

## 2015-08-18 DIAGNOSIS — Z992 Dependence on renal dialysis: Principal | ICD-10-CM

## 2015-08-18 DIAGNOSIS — N186 End stage renal disease: Secondary | ICD-10-CM

## 2015-08-18 DIAGNOSIS — I10 Essential (primary) hypertension: Secondary | ICD-10-CM

## 2015-08-25 NOTE — Congregational Nurse Program (Unsigned)
Congregational Nurse Program Note  Date of Encounter: 08/18/2015  Past Medical History: Past Medical History  Diagnosis Date  . Dialysis patient ([place])   . Hypertension   . Anemia   . S/P hemodialysis catheter insertion ([place])     Right Internal Jugular   . Hearing loss   . Renal disorder   . ESRF (end stage renal failure) ([place])   . ESRD on dialysis ([place]) 09/30/2013  . 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 Existing     Office visit at [place] [employer] school to follow-up blood pressure status. Reports feeling "ok". Overall appearance showing improvement since living with family member and obviously receiving support in care. Continues to attend English classes weekly. Shunt in left arm intact; covered with dressing. Small amount of dried blood on dressing covering 1/2 inch area. Denies pain at shunt site. Right pulse 87 and regular. Left radial pulse rapid, thready at 122/minute. Attending sceduled dialysis later today at [facility]; [address] Return prn for BP follow-up. Jannetta Quint, RN/CN.

## 2015-12-01 ENCOUNTER — Other Ambulatory Visit: Payer: Self-pay | Admitting: Nephrology

## 2015-12-01 DIAGNOSIS — R1011 Right upper quadrant pain: Secondary | ICD-10-CM

## 2015-12-10 ENCOUNTER — Other Ambulatory Visit: Payer: Medicaid Other

## 2015-12-20 ENCOUNTER — Ambulatory Visit
Admission: RE | Admit: 2015-12-20 | Discharge: 2015-12-20 | Disposition: A | Discharge status: Home / Self Care | Payer: Medicaid Other | Source: Ambulatory Visit | Attending: Nephrology | Admitting: Nephrology

## 2015-12-20 DIAGNOSIS — R1011 Right upper quadrant pain: Secondary | ICD-10-CM

## 2016-02-01 NOTE — Congregational Nurse Program (Signed)
Congregational Nurse Program Note  Date of Encounter: 02/01/2016  Past Medical History: Past Medical History:  Diagnosis Date  . Anemia   . Dialysis patient ([place])   . ESRD on dialysis ([place]) 09/30/2013  . ESRF (end stage renal failure) ([place])   . Hearing loss   . Hypertension   . 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 Renal disorder   . S/P hemodialysis catheter insertion ([place])    Right Internal Jugular   . Tuberculosis 10/05/2013   Latent; CXR negative; asymptomatic;     Encounter Details:     CNP Questionnaire - 02/01/16 1503      Patient Demographics   Is this a new or existing patient? Existing   Patient is considered a/an Refugee   Race Asian     Patient Assistance   Location of Patient Assistance Not Applicable   Patient's financial/insurance status Medicaid   Uninsured Patient (Orange Oncologist) Knaak   Patient referred to apply for the following financial assistance Not Applicable   Food insecurities addressed Not Applicable   Transportation assistance [place]   Assistance securing medications [place]   Educational health offerings Nutrition     Encounter Details   Primary purpose of visit Chronic Illness/Condition Visit   Was an Emergency Department visit averted? Not Applicable   Does patient have a medical provider? [place]street   Patient referred to Establish PCP   Was a mental health screening completed? (GAINS tool) Najarian   Does patient have dental issues? Mullett   Does patient have vision issues? Beyl   Does your patient have an abnormal blood pressure today? Knauer   Since previous encounter, have you referred patient for abnormal blood pressure that resulted in a new diagnosis or medication change? Fassnacht   Does your patient have an abnormal blood glucose today? Smiles   Since previous encounter, have you referred patient for abnormal blood glucose that resulted in a new diagnosis or medication change? Codner   Was there a life-saving intervention made? Woodyard     Referral from [place] office staff member  to follow-up with nurse due to recent absence from program. Currently continues with kidney dialysis treatment  on Monday, Wednesday and Friday. Complains of weakness and tingling in hands, fingers and lower extremities. Able to manipulate hands and fingers well. Normal gait, but movements slow and usual. Dressing intact and dry over shunt site. Alert and answered questions through interpretation by friend.  Lips very dry and dehydrated. Jeremiah fluids or food today. Daily H2O allowance 16 oz. Drinks nutritional supplement. Advised to eat and drink fluids as allocated. Blood pressure rarely low as today. Refer to nurse and nutritionist at [place] on 02/02/16. Follow-up B/P during attendance weekly at [facility]. Return on 02/08/16 for referral to PCP and assistance with scheduling appointment. Jannetta Quint, RN/CN.

## 2016-02-15 NOTE — Congregational Nurse Program (Signed)
Congregational Nurse Program Note  Date of Encounter: 02/09/2016  Past Medical History: Past Medical History:  Diagnosis Date  . Anemia   . Dialysis patient ([place])   . ESRD on dialysis ([place]) 09/30/2013  . ESRF (end stage renal failure) ([place])   . Hearing loss   . Hypertension   . 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 Renal disorder   . S/P hemodialysis catheter insertion ([place])    Right Internal Jugular   . Tuberculosis 10/05/2013   Latent; CXR negative; asymptomatic;     Encounter Details:     CNP Questionnaire - 02/09/16 0933      Patient Demographics   Is this a new or existing patient? Existing   Patient is considered a/an Refugee   Race Asian     Patient Assistance   Location of Patient Assistance Not Applicable   Patient's financial/insurance status Medicaid   Uninsured Patient (Orange Oncologist) [place]bert   Patient referred to apply for the following financial assistance Not Applicable   Food insecurities addressed Not Applicable   Transportation assistance [place]   Assistance securing medications [place]   Educational health offerings Nutrition     Encounter Details   Primary purpose of visit Chronic Illness/Condition Visit   Was an Emergency Department visit averted? Not Applicable   Does patient have a medical provider? Wickwire   Patient referred to Establish PCP   Was a mental health screening completed? (GAINS tool) Duve   Does patient have dental issues? Anastacio   Does patient have vision issues? Ashenfelter   Does your patient have an abnormal blood pressure today? Siragusa   Since previous encounter, have you referred patient for abnormal blood pressure that resulted in a new diagnosis or medication change? Tawney   Does your patient have an abnormal blood glucose today? Gibeau   Since previous encounter, have you referred patient for abnormal blood glucose that resulted in a new diagnosis or medication change? Furgerson   Was there a life-saving intervention made? Scrima     Office visit for this Burmese student  to request B/P recheck and PCP appointment.  Limited English, but communicates needs effectively, usually. Speaks Burmese and mainly Elmore. Unable to understand interpreters often die to hearing difficulty. Hearing impaired with hearing aids. Concerned about chronic pain and weakness in right  hand and right foot today. Loss of balance noted during dressing. Blood pressure stable with medication.  Dialysis shunt intact with clean and dry dressing. Referred to [facility] for primary care. Continue to check B/P at [place] office. Jannetta Quint, RN/CN

## 2016-02-15 NOTE — Congregational Nurse Program (Signed)
Congregational Nurse Program Note  Date of Encounter: 02/15/2016  Past Medical History: Past Medical History:  Diagnosis Date  . Anemia   . Dialysis patient ([place])   . ESRD on dialysis ([place]) 09/30/2013  . ESRF (end stage renal failure) ([place])   . Hearing loss   . Hypertension   . 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 Renal disorder   . S/P hemodialysis catheter insertion ([place])    Right Internal Jugular   . Tuberculosis 10/05/2013   Latent; CXR negative; asymptomatic;     Encounter Details:     CNP Questionnaire - 02/15/16 0930      Patient Demographics   Is this a new or existing patient? Existing   Patient is considered a/an Refugee   Race Asian     Patient Assistance   Location of Patient Assistance Not Applicable   Patient's financial/insurance status Medicaid   Uninsured Patient (Orange Oncologist) Daniele   Patient referred to apply for the following financial assistance Not Applicable   Food insecurities addressed Not Applicable   Transportation assistance [place]   Assistance securing medications [facility]   Educational health offerings Nutrition     Encounter Details   Primary purpose of visit Chronic Illness/Condition Visit   Was an Emergency Department visit averted? Not Applicable   Does patient have a medical provider? Postma   Patient referred to Establish PCP   Was a mental health screening completed? (GAINS tool) Gens   Does patient have dental issues? Hemm   Does patient have vision issues? Doten   Does your patient have an abnormal blood pressure today? Piekarski   Since previous encounter, have you referred patient for abnormal blood pressure that resulted in a new diagnosis or medication change? Hession   Does your patient have an abnormal blood glucose today? Reeder   Since previous encounter, have you referred patient for abnormal blood glucose that resulted in a new diagnosis or medication change? Lever   Was there a life-saving intervention made? Crate     Office visit to request assistance to  refill medications: Losartan 100 mg and Sensipar 60 mg. Refill requested at [facility], [place]. Medications reviewed and client agreed to pick up meds. Return prn to check other refills and follow-up appointment with PCP pain in extremities. Jannetta Quint, RN/CN.

## 2016-06-08 IMAGING — CR DG CHEST 1V PORT
1 series · 1 of 1 positions shown · non-contrast
Comparison: 09/26/2013

CLINICAL DATA: Diabetic catheter insertion.

EXAM:
PORTABLE CHEST - 1 VIEW

[AP]
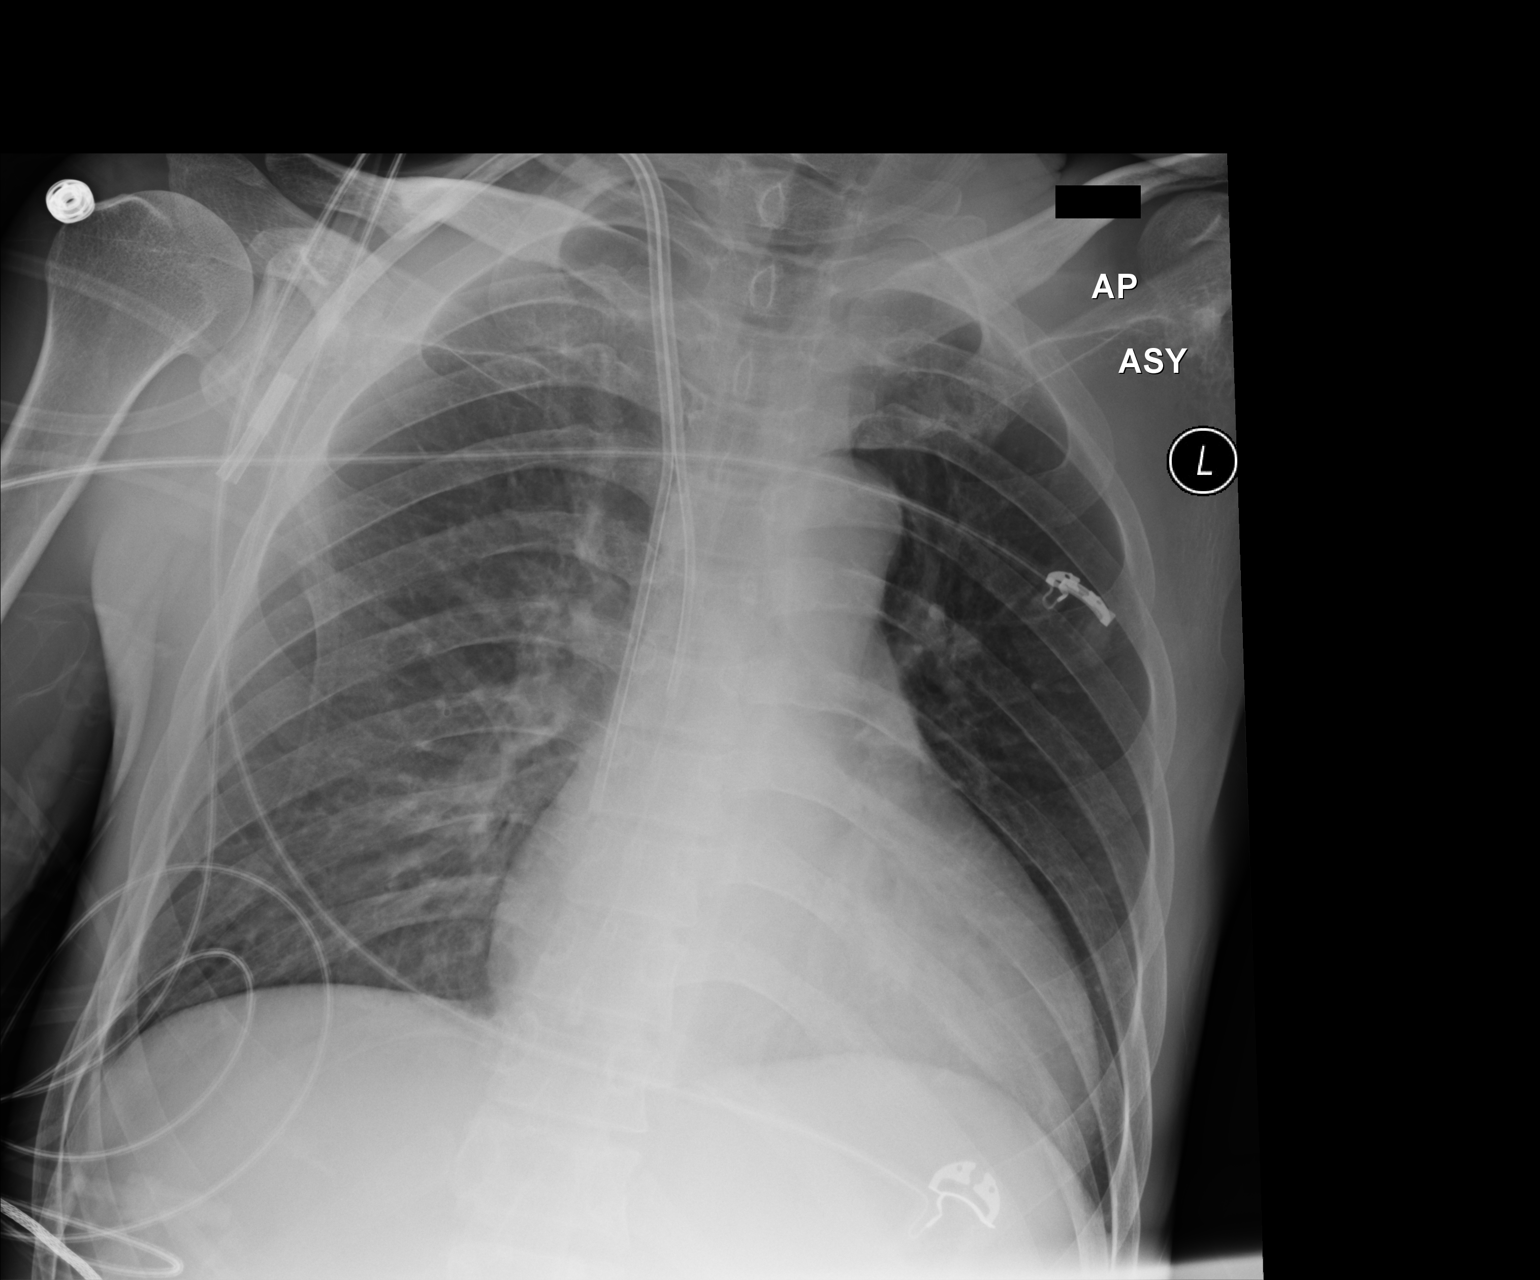

[1 of 1 positions shown; findings below may reference images not displayed]

FINDINGS: Double lumen catheter has been inserted and the tips appear in good
position in the superior vena cava and cavoatrial junction. No
pneumothorax. Pulmonary vascularity is normal and the lungs are
clear. No osseous abnormality. Slight cardiomegaly.
IMPRESSION: Diatek catheter appears in good position.  No pneumothorax.

## 2017-01-09 ENCOUNTER — Ambulatory Visit
Admission: RE | Admit: 2017-01-09 | Discharge: 2017-01-09 | Disposition: A | Discharge status: Home / Self Care | Payer: Medicaid Other | Source: Ambulatory Visit | Attending: Nephrology | Admitting: Nephrology

## 2017-01-09 ENCOUNTER — Other Ambulatory Visit: Payer: Self-pay | Admitting: Nephrology

## 2017-01-09 DIAGNOSIS — R509 Fever, unspecified: Secondary | ICD-10-CM

## 2017-03-20 HISTORY — PX: COLONOSCOPY: SHX174

## 2017-03-20 HISTORY — PX: POLYPECTOMY: SHX149

## 2017-04-13 ENCOUNTER — Encounter: Payer: Self-pay | Admitting: Internal Medicine

## 2017-04-26 ENCOUNTER — Ambulatory Visit (HOSPITAL_COMMUNITY): Payer: Self-pay | Admitting: Dentistry

## 2017-04-26 ENCOUNTER — Encounter (HOSPITAL_COMMUNITY): Payer: Self-pay | Admitting: Dentistry

## 2017-04-26 VITALS — BP 109/74 | HR 63 | Temp 98.2°F

## 2017-04-26 DIAGNOSIS — Z992 Dependence on renal dialysis: Principal | ICD-10-CM

## 2017-04-26 DIAGNOSIS — M2630 Unspecified anomaly of tooth position of fully erupted tooth or teeth: Secondary | ICD-10-CM

## 2017-04-26 DIAGNOSIS — N186 End stage renal disease: Secondary | ICD-10-CM | POA: Diagnosis not present

## 2017-04-26 DIAGNOSIS — K0530 Chronic periodontitis, unspecified: Secondary | ICD-10-CM

## 2017-04-26 DIAGNOSIS — M264 Malocclusion, unspecified: Secondary | ICD-10-CM

## 2017-04-26 DIAGNOSIS — K036 Deposits [accretions] on teeth: Secondary | ICD-10-CM | POA: Diagnosis not present

## 2017-04-26 DIAGNOSIS — Z01818 Encounter for other preprocedural examination: Secondary | ICD-10-CM

## 2017-04-26 DIAGNOSIS — K030 Excessive attrition of teeth: Secondary | ICD-10-CM

## 2017-04-26 NOTE — Patient Instructions (Signed)
The patient is scheduled for initial periodontal therapy on Tuesday, 05/01/2017 at 8 AM. Dr. Enrique Sack

## 2017-04-26 NOTE — Progress Notes (Signed)
DENTAL CONSULTATION  Date of Consultation:  04/26/2017 Patient Name:   Keith Vaughan Date of Birth:   10-05-1965 Medical Record Number: [id number]  VITALS: BP 109/74 (BP Location: Right Arm)   Pulse 63   Temp 98.2 F (36.8 C)   CHIEF COMPLAINT: The patient referred by Dr. Jimmy Footman for a dental consultation.  HPI: Keith Vaughan is a 52 year old Burmese male with end-stage renal disease on hemodialysis on Monday, Wednesday, and Friday. Patient with anticipated kidney transplant in the future. Patient is now seen as part of a pre-kidney transplant dental protocol examination.  The patient presents with his pastor and the [place] interpreter.  The patient denies having any acute toothaches, swellings, or abscesses. Patient was last seen by dentist in 2015 for an exam and cleaning in [place], [place]. Patient does not have a primary dentist at this time. Patient denies having dental phobia.   PROBLEM LIST: Patient Active Problem List   Diagnosis Date Noted  . Language barrier 01/19/2014  . Refugee health examination 01/19/2014  . Tuberculosis 10/05/2013  . 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 ESRD (end stage renal disease) on dialysis ([facility]) 09/26/2013  . HTN (hypertension) 09/26/2013  . Bradycardia 09/26/2013  . Hearing loss in right ear, severe 09/26/2013  . Hearing loss in left ear, mild 09/26/2013  . Anemia of chronic disease 09/26/2013  . Thrombocytopenia, unspecified ([facility]) 09/26/2013    PMH: Past Medical History:  Diagnosis Date  . Anemia   . Dialysis patient ([facility])   . ESRD on dialysis ([facility]) 09/30/2013  . ESRF (end stage renal failure) ([facility])   . Hearing loss   . Hypertension   . 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 Renal disorder   . S/P hemodialysis catheter insertion ([facility])    Right Internal Jugular   . Tuberculosis 10/05/2013   Latent; CXR negative; asymptomatic;     PSH: Past Surgical History:  Procedure Laterality Date  . AV FISTULA PLACEMENT Left 10/01/2013   Procedure:  RADIOCEPHALIC ARTERIOVENOUS (AV) FISTULA CREATION LEFT ARM;  Surgeon: Mal Misty, MD;  Location: [facility];  Service: Vascular;  Laterality: Left;  . INSERTION OF DIALYSIS CATHETER Right 10/01/2013   Procedure: INSERTION OF DIALYSIS CATHETER- Right Internal Jugular;  Surgeon: Mal Misty, MD;  Location: [facility];  Service: Vascular;  Laterality: Right;  . leg trauma Bilateral    debridement  . LIPOMA EXCISION Left 2014   left thigh lipoma    ALLERGIES: [place]ellers Known Allergies  MEDICATIONS: Current Outpatient Medications  Medication Sig Dispense Refill  . B Complex-C-Folic Acid (DIALYVITE 174 PO) Take 1 tablet by mouth daily.    . cinacalcet (SENSIPAR) 60 MG tablet Take 60 mg by mouth daily.    [name] Dextromethorphan-Guaifenesin (MUCINEX DM) 30-600 MG TB12 Take 1 tablet by mouth 2 (two) times daily.    . diphenhydramine-acetaminophen (TYLENOL PM) 25-500 MG TABS tablet Take 1 tablet by mouth at bedtime as needed.    . lactulose (CEPHULAC) 20 g packet Take 40 g by mouth 3 (three) times daily.    [name] losartan (COZAAR) 100 MG tablet Take 100 mg by mouth daily.    . sevelamer (RENAGEL) 800 MG tablet Take 5,600 mg by mouth 3 (three) times daily with meals.    . FERROUS FUMARATE PO Take 89 mg by mouth 2 (two) times daily.     [name] lanthanum (FOSRENOL) 1000 MG chewable tablet Chew 1,000 mg by mouth 3 (three) times daily with meals.    . NON FORMULARY Take 500 mg  by mouth daily. "Progesic"     [place] current facility-administered medications for this visit.     LABS: Lab Results  Component Value Date   WBC 7.6 11/15/2013   HGB 15.6 11/15/2013   HCT 46.0 11/15/2013   MCV 88.2 11/15/2013   PLT 42 (L) 11/15/2013      Component Value Date/Time   NA 133 (L) 11/15/2013 1605   K 4.6 11/15/2013 1605   CL 94 (L) 11/15/2013 1605   CO2 24 10/09/2013 0730   GLUCOSE 91 11/15/2013 1605   BUN 17 11/15/2013 1605   CREATININE 5.80 (H) 11/15/2013 1605   CALCIUM 10.5 10/09/2013 0730   GFRNONAA 6 (L) 10/09/2013 0730    GFRAA 6 (L) 10/09/2013 0730   [place] results found for: INR, PROTIME Aigner results found for: PTT  SOCIAL HISTORY: Social History   Socioeconomic History  . Marital status: Single    Spouse name: Not on file  . Number of children: Not on file  . Years of education: Not on file  . Highest education level: Not on file  Social Needs  . Financial resource strain: Not on file  . Food insecurity - worry: Not on file  . Food insecurity - inability: Not on file  . Transportation needs - medical: Not on file  . Transportation needs - non-medical: Not on file  Occupational History  . Not on file  Tobacco Use  . Smoking status: Current Every Day Smoker  . Smokeless tobacco: Never Used  Substance and Sexual Activity  . Alcohol use: [place]    Alcohol/week: 0.0 oz  . Drug use: [place]  . Sexual activity: Not on file  Other Topics Concern  . Not on file  Social History Narrative  . Not on file    FAMILY HISTORY: History reviewed. Imes pertinent family history.  REVIEW OF SYSTEMS: Reviewed with the patient as per History of present illness. Psych: Patient denies having dental phobia.  DENTAL HISTORY: CHIEF COMPLAINT: The patient referred by Dr. Jimmy Footman for a dental consultation.  HPI: Keith Vaughan is a 52 year old Burmese male with end-stage renal disease on hemodialysis on Monday, Wednesday, and Friday. Patient with anticipated kidney transplant in the future. Patient is now seen as part of a pre-kidney transplant dental protocol examination.  The patient presents with his pastor and the [place] interpreter.  The patient denies having any acute toothaches, swellings, or abscesses. Patient was last seen by dentist in 2015 for an exam and cleaning in [place], [place]. Patient does not have a primary dentist at this time. Patient denies having dental phobia.  DENTAL EXAMINATION: GENERAL:  The patient is a well-developed, slightly built male in [place] acute distress. HEAD AND NECK: There is  Hoaglund palpable neck lymphadenopathy. The patient denies acute TMJ symptoms. INTRAORAL EXAM:  The patient has normal saliva. There is Truax evidence of oral abscess formation. DENTITION:  The patient is not missing any teeth.  Patient has multiple malpositioned teeth. There is evidence of occlusal and incisal attrition. PERIODONTAL:  The patient has chronic periodontitis with plaque and calculus and stain accumulations, gingival recession, and Kraai significant tooth mobility.  There is incipient to moderate bone loss noted. DENTAL CARIES/SUBOPTIMAL RESTORATIONS:  Fantroy obvious dental caries are noted. ENDODONTIC:  The patient denies acute pulpitis symptoms. I do not see any evidence of periapical pathology or radiolucency. CROWN AND BRIDGE:  There are Deroo crown or bridge restorations. PROSTHODONTIC:  There are Dwyer partial dentures. OCCLUSION:  The patient has a poor  occlusal scheme and malocclusion, but a stable occlusion.  RADIOGRAPHIC INTERPRETATION: An orthopantogram was taken and supplemented with a full series of dental radiographs. There are Klus missing teeth. There are multiple malpositioned teeth. There is incipient to moderate bone loss noted. Radiographic calculus is noted. There are Premo obvious periapical radiolucencies noted.  ASSESSMENTS: 1.  End stage renal disease on hemodialysis Monday, Wednesday, and Friday 2.  Pre-kidney transplant dental protocol 3. Chronic periodontitis with bone loss 4. Gingival recession 5. Accretions 6. Multiple malpositioned teeth 7. Poor occlusal scheme and malocclusion   PLAN/RECOMMENDATIONS: 1. I discussed the risks, benefits, and complications of various treatment options with the patient in relationship to his medical and dental conditions, end-stage renal disease, and anticipated kidney transplant surgery.  We discussed various treatment options to include Rapley treatment, periodontal therapy, and need for a new primary dentist to provide routine periodontal  maintenance procedures. The patient currently agrees to proceed with a gross debridement periodontal procedure for next Tuesday, 05/01/2017 at 8 AM.  The patient will then need follow-up with a new primary dentist for continued dental care.The Laretta Bolster will assist in the process of finding a primary dentist for the patient.   2. Discussion of findings with medical team and coordination of future medical and dental care as needed.  I spent in excess of  90 minutes during the conduct of this consultation and >50% of this time involved direct face-to-face encounter for counseling and/or coordination of the patient's care.    Lenn Cal, DDS

## 2017-05-01 ENCOUNTER — Ambulatory Visit (HOSPITAL_COMMUNITY): Payer: Medicaid - Dental | Admitting: Dentistry

## 2017-05-01 ENCOUNTER — Encounter (HOSPITAL_COMMUNITY): Payer: Self-pay | Admitting: Dentistry

## 2017-05-01 VITALS — BP 117/82 | HR 67 | Temp 97.9°F

## 2017-05-01 DIAGNOSIS — K0530 Chronic periodontitis, unspecified: Secondary | ICD-10-CM

## 2017-05-01 DIAGNOSIS — N186 End stage renal disease: Secondary | ICD-10-CM

## 2017-05-01 DIAGNOSIS — Z992 Dependence on renal dialysis: Principal | ICD-10-CM

## 2017-05-01 DIAGNOSIS — K036 Deposits [accretions] on teeth: Secondary | ICD-10-CM

## 2017-05-01 DIAGNOSIS — Z01818 Encounter for other preprocedural examination: Secondary | ICD-10-CM

## 2017-05-01 NOTE — Progress Notes (Signed)
05/01/2017  Patient:            Keith Vaughan Date of Birth:  [birth year] MRN:                [id number]   BP 117/82 (BP Location: Right Arm)   Pulse 67   Temp 97.9 F (36.6 C)    Keith Vaughan is a 52 year old Burmese male that presents with his interpreter for dental treatment. Patient denies any acute medical or dental changes. Patient agrees to proceed with gross debridement procedure this morning.  Procedure: T2763 Gross debridement procedure. Cavitron was used to remove significant accretions. A series of hand curettes were then used to further remove accretions.  The Cavitron was then again used to further refine removal of accretions. The teeth were polished. The teeth were then flossed. Oral hygiene instruction was provided. Patient tolerated the procedure well.  Patient is currently cleared for kidney transplant as indicated. The patient is to follow-up with the dentist of his choice for routine periodontal maintenance procedures. This dentist follow-up will be facilitated by the Laretta Bolster who has a dentist that his congregation is able to seek treatment with.   Armond Hang, DDS

## 2017-05-01 NOTE — Patient Instructions (Signed)
1. Brush after meals and at bedtime. 2. Flossed bedtime. 3. Follow up with the primary dentist of his choice for continued periodontal treatment as indicated. 4. Patient is currently cleared for kidney transplant surgery.  Lenn Cal, DDS

## 2017-05-31 ENCOUNTER — Ambulatory Visit (AMBULATORY_SURGERY_CENTER): Payer: Self-pay

## 2017-05-31 VITALS — Ht 65.5 in | Wt 116.4 lb

## 2017-05-31 DIAGNOSIS — Z1211 Encounter for screening for malignant neoplasm of colon: Secondary | ICD-10-CM

## 2017-05-31 NOTE — Progress Notes (Signed)
Khim Bahadur( interpretor) came with pt to the PV to help with medical questions and explain the prep. The pre-visit took over 1 hour and 40 minutes to complete. Pt did not understand instructions very well and does not know anyone at home who speaks English to help with paperwork.  Paperwork was reviewed with pt several times and questions were answered.   Per interpretor Diskin allergies to soy or egg products.Pt not taking any weight loss meds or using  O2 at home.   Emmi video sent to pt's email, interpretor informed pt.

## 2017-06-14 ENCOUNTER — Other Ambulatory Visit: Payer: Self-pay

## 2017-06-14 ENCOUNTER — Ambulatory Visit (AMBULATORY_SURGERY_CENTER): Payer: Medicaid Other | Admitting: Internal Medicine

## 2017-06-14 ENCOUNTER — Encounter: Payer: Self-pay | Admitting: Internal Medicine

## 2017-06-14 VITALS — BP 158/95 | HR 71 | Temp 97.3°F | Resp 15 | Ht 65.5 in | Wt 116.0 lb

## 2017-06-14 DIAGNOSIS — D123 Benign neoplasm of transverse colon: Secondary | ICD-10-CM | POA: Diagnosis not present

## 2017-06-14 DIAGNOSIS — Z1211 Encounter for screening for malignant neoplasm of colon: Secondary | ICD-10-CM

## 2017-06-14 DIAGNOSIS — D124 Benign neoplasm of descending colon: Secondary | ICD-10-CM | POA: Diagnosis not present

## 2017-06-14 MED ORDER — SODIUM CHLORIDE 0.9 % IV SOLN: 500.0000 mL | Freq: Once | INTRAVENOUS | Status: DC

## 2017-06-14 NOTE — Progress Notes (Signed)
Called to room to assist during endoscopic procedure.  Patient ID and intended procedure confirmed with present staff. Received instructions for my participation in the procedure from the performing physician.  

## 2017-06-14 NOTE — Patient Instructions (Addendum)
   I found and removed 2 small polyps. Keith Vaughan signs of cancer. I will let you know pathology results and when to have another routine colonoscopy by mail and/or My Chart.  I appreciate the opportunity to care for you. [name], MD, FACG   YOU HAD AN ENDOSCOPIC PROCEDURE TODAY: Refer to the procedure report and other information in the discharge instructions given to you for any specific questions about what was found during the examination. If this information does not answer your questions, please call [facility] office at [phone] to clarify.   YOU SHOULD EXPECT: Some feelings of bloating in the abdomen. Passage of more gas than usual. Walking can help get rid of the air that was put into your GI tract during the procedure and reduce the bloating. If you had a lower endoscopy (such as a colonoscopy or flexible sigmoidoscopy) you may notice spotting of blood in your stool or on the toilet paper. Some abdominal soreness may be present for a day or two, also.  DIET: Your first meal following the procedure should be a light meal and then it is ok to progress to your normal diet. A half-sandwich or bowl of soup is an example of a good first meal. Heavy or fried foods are harder to digest and may make you feel nauseous or bloated. Drink plenty of fluids but you should avoid alcoholic beverages for 24 hours. If you had a esophageal dilation, please see attached instructions for diet.    ACTIVITY: Your care partner should take you home directly after the procedure. You should plan to take it easy, moving slowly for the rest of the day. You can resume normal activity the day after the procedure however YOU SHOULD NOT DRIVE, use power tools, machinery or perform tasks that involve climbing or major physical exertion for 24 hours (because of the sedation medicines used during the test).   SYMPTOMS TO REPORT IMMEDIATELY: A gastroenterologist can be reached at any hour. Please call [phone]  for  any of the following symptoms:  Following lower endoscopy (colonoscopy, flexible sigmoidoscopy) Excessive amounts of blood in the stool  Significant tenderness, worsening of abdominal pains  Swelling of the abdomen that is new, acute  Fever of 100 or higher    FOLLOW UP:  If any biopsies were taken you will be contacted by phone or by letter within the next 1-3 weeks. Call [phone]  if you have not heard about the biopsies in 3 weeks.  Please also call with any specific questions about appointments or follow up tests.

## 2017-06-14 NOTE — Progress Notes (Signed)
Report to PACU, RN, vss, BBS= Clear.  

## 2017-06-14 NOTE — Op Note (Signed)
[facility] Patient Name: Keith Vaughan Procedure Date: 06/14/2017 8:35 AM MRN: [id number] Endoscopist: [name] , MD Age: 52 Referring MD:  Date of Birth: 1965-08-16 Gender: Male Account #: [id number] Procedure:                Colonoscopy Indications:              Screening for colorectal malignant neoplasm, This                            is the patient's first colonoscopy Medicines:                Propofol per Anesthesia, Monitored Anesthesia Care Procedure:                Pre-Anesthesia Assessment:                           - Prior to the procedure, a History and Physical                            was performed, and patient medications and                            allergies were reviewed. The patient's tolerance of                            previous anesthesia was also reviewed. The risks                            and benefits of the procedure and the sedation                            options and risks were discussed with the patient.                            All questions were answered, and informed consent                            was obtained. Prior Anticoagulants: The patient has                            taken [name] previous anticoagulant or antiplatelet                            agents. ASA Grade Assessment: III - A patient with                            severe systemic disease. After reviewing the risks                            and benefits, the patient was deemed in                            satisfactory condition to undergo the procedure.  After obtaining informed consent, the colonoscope                            was passed under direct vision. Throughout the                            procedure, the patient's blood pressure, pulse, and                            oxygen saturations were monitored continuously. The                            Colonoscope was introduced through the anus and   advanced to the the cecum, identified by                            appendiceal orifice and ileocecal valve. The                            colonoscopy was performed without difficulty. The                            patient tolerated the procedure well. The quality                            of the bowel preparation was good. The ileocecal                            valve, appendiceal orifice, and rectum were                            photographed. The bowel preparation used was                            Miralax. Scope In: 8:48:23 AM Scope Out: 9:01:14 AM Scope Withdrawal Time: 0 hours 9 minutes 45 seconds  Total Procedure Duration: 0 hours 12 minutes 51 seconds  Findings:                 The perianal and digital rectal examinations were                            normal. Pertinent negatives include normal prostate                            (size, shape, and consistency).                           A 7 mm polyp was found in the descending colon. The                            polyp was sessile. The polyp was removed with a                            cold snare. Resection and retrieval  were complete.                            Verification of patient identification for the                            specimen was done. Estimated blood loss was minimal.                           A 2 mm polyp was found in the transverse colon. The                            polyp was sessile. The polyp was removed with a                            cold biopsy forceps. Resection and retrieval were                            complete. Verification of patient identification                            for the specimen was done. Estimated blood loss was                            minimal.                           The exam was otherwise without abnormality on                            direct and retroflexion views. Complications:            Keith Vaughan immediate complications. Estimated Blood Loss:     Estimated blood  loss was minimal. Impression:               - One 7 mm polyp in the descending colon, removed                            with a cold snare. Resected and retrieved.                           - One 2 mm polyp in the transverse colon, removed                            with a cold biopsy forceps. Resected and retrieved.                           - The examination was otherwise normal on direct                            and retroflexion views. Recommendation:           - Patient has a contact number available for  emergencies. The signs and symptoms of potential                            delayed complications were discussed with the                            patient. Return to normal activities tomorrow.                            Written discharge instructions were provided to the                            patient.                           - Resume previous diet.                           - Continue present medications.                           - Repeat colonoscopy is recommended. The                            colonoscopy date will be determined after pathology                            results from today's exam become available for                            review. [name], MD 06/14/2017 9:06:28 AM This report has been signed electronically.

## 2017-06-14 NOTE — Progress Notes (Signed)
Pt's states [place] medical or surgical changes since previsit or office visit. 

## 2017-06-15 ENCOUNTER — Telehealth: Payer: Self-pay

## 2017-06-15 NOTE — Telephone Encounter (Signed)
  Follow up Call-  Call back number 06/14/2017  Post procedure Call Back phone  # [phone]  Permission to leave phone message Yes  Some recent data might be hidden     Patient questions:  Do you have a fever, pain , or abdominal swelling? [place]. Pain Score  0 *  Have you tolerated food without any problems? Yes.    Have you been able to return to your normal activities? Yes.    Do you have any questions about your discharge instructions: Diet   [place]. Medications  Pickup. Follow up visit  [place].  Do you have questions or concerns about your Care? [place].  Spoke with pt.  Best I could determine with language barrier pt. Answered affirmatively he was fine.  Actions: * If pain score is 4 or above: [place] action needed, pain <4.

## 2017-06-22 ENCOUNTER — Encounter: Payer: Self-pay | Admitting: Internal Medicine

## 2017-06-22 DIAGNOSIS — Z86010 Personal history of colonic polyps: Secondary | ICD-10-CM

## 2017-06-22 DIAGNOSIS — Z860101 Personal history of adenomatous and serrated colon polyps: Secondary | ICD-10-CM

## 2017-06-22 HISTORY — DX: Personal history of adenomatous and serrated colon polyps: Z86.0101

## 2017-06-22 HISTORY — DX: Personal history of colonic polyps: Z86.010

## 2017-06-22 NOTE — Progress Notes (Signed)
7 mm adenoma and a benign mucosal polyp Recall 2024

## 2018-08-27 IMAGING — US US ABDOMEN COMPLETE
1 series · 13 of 25 positions shown · non-contrast
Comparison: Abdominal ultrasound September 26, 2013

CLINICAL DATA: Intermittent right upper quadrant pain for the past
several months. Also intermittent right lower quadrant pain. Patient
on dialysis.

EXAM:
ABDOMEN ULTRASOUND COMPLETE

[Series 1: us abdomen complete · 0.24mm/px · 13 of 71 slices shown]
[im 1/71]
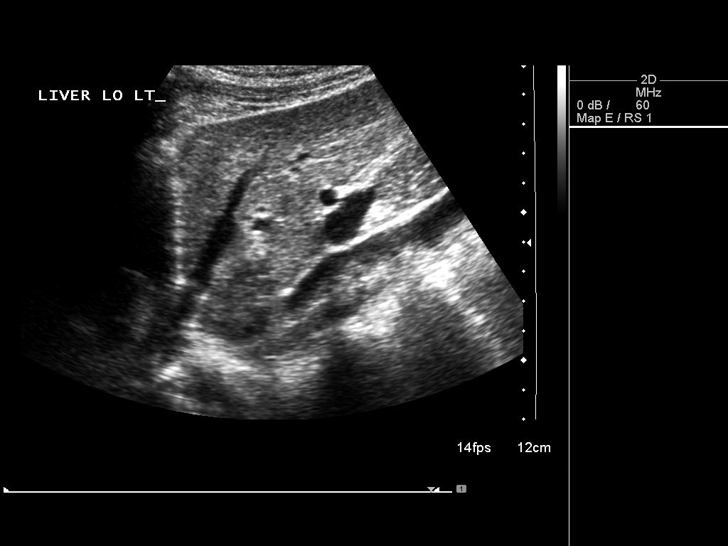
[im 6/71]
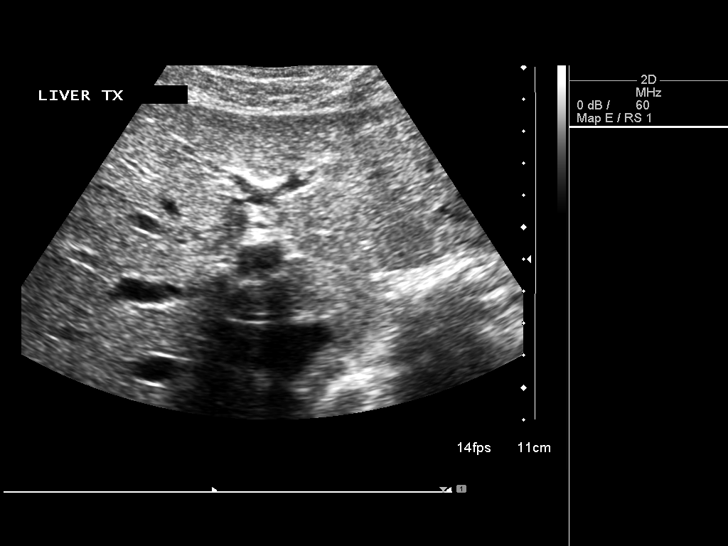
[im 12/71]
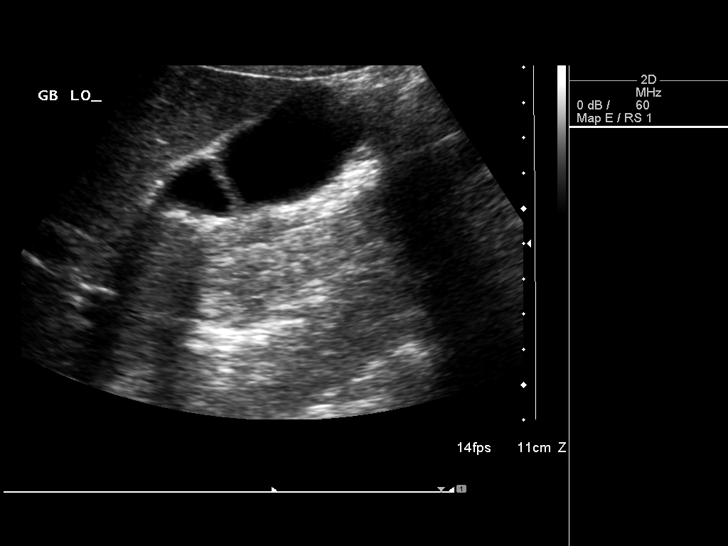
[im 18/71]
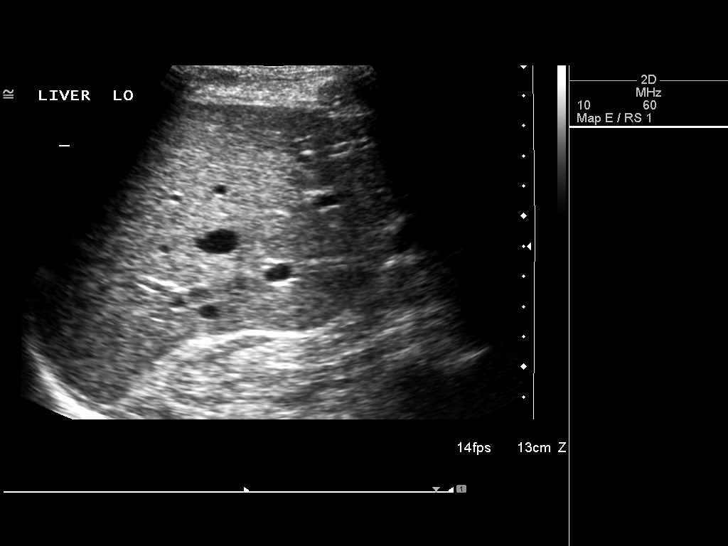
[im 24/71]
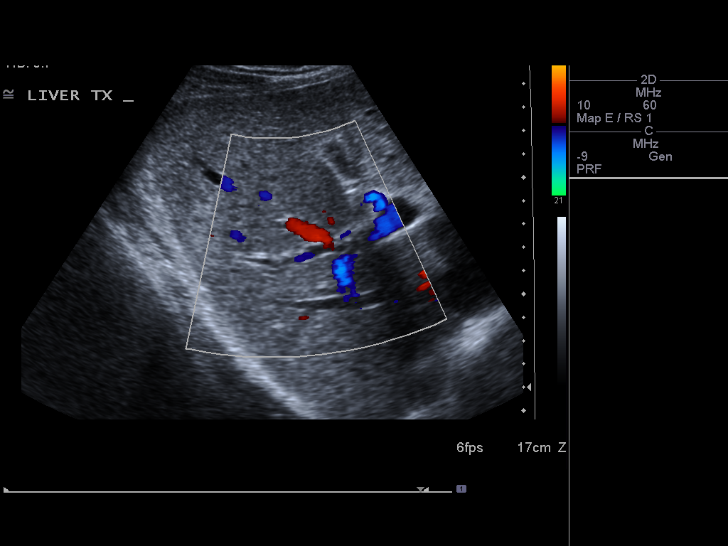
[im 30/71]
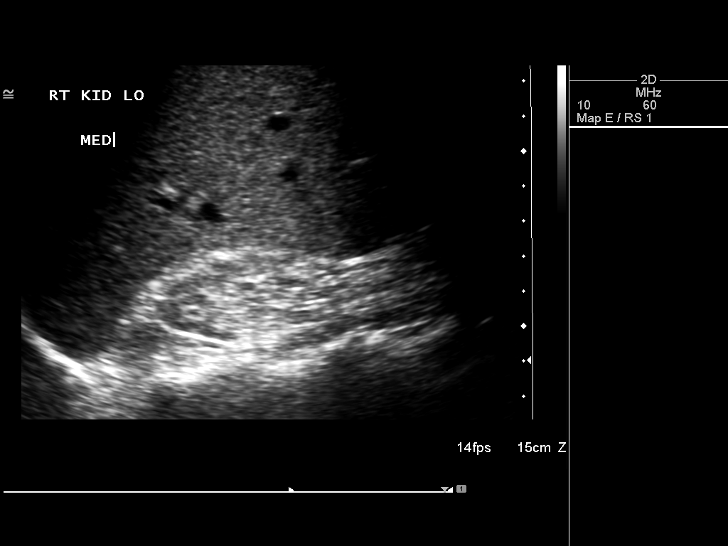
[im 36/71]
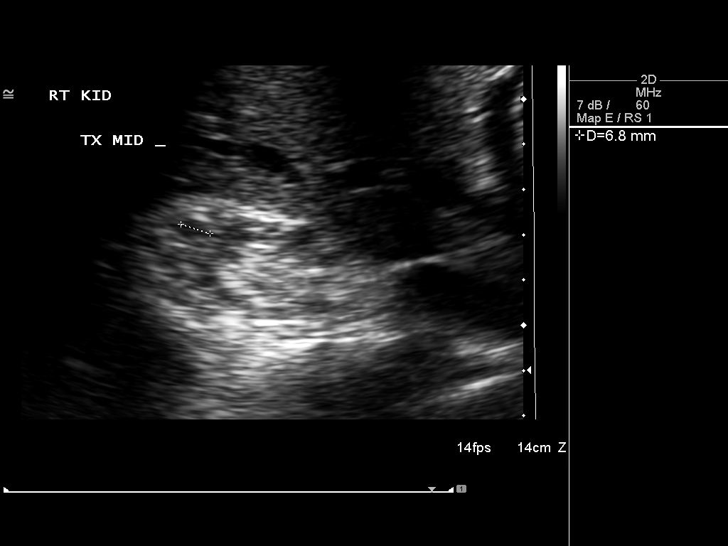
[im 41/71]
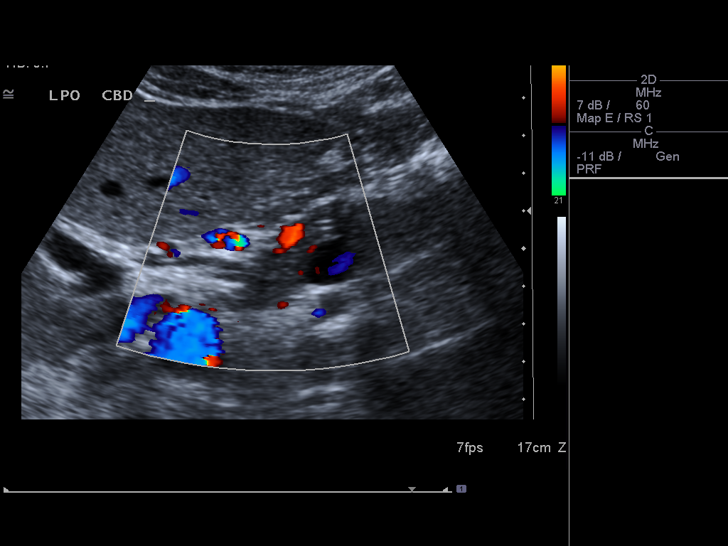
[im 47/71]
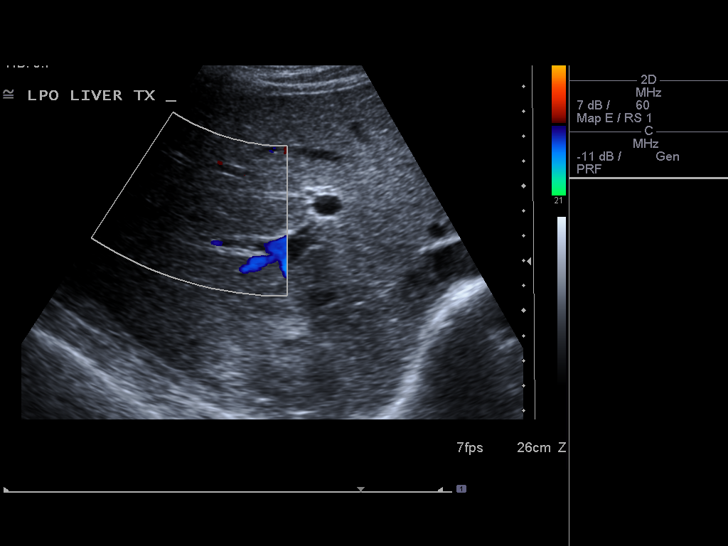
[im 53/71]
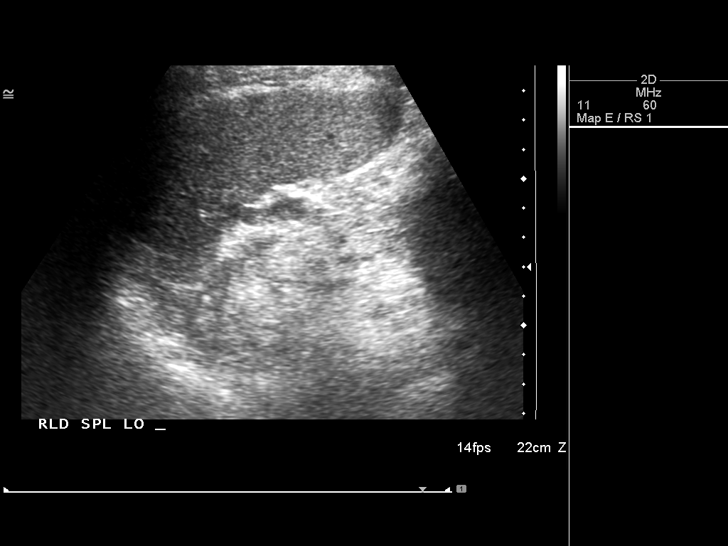
[im 59/71]
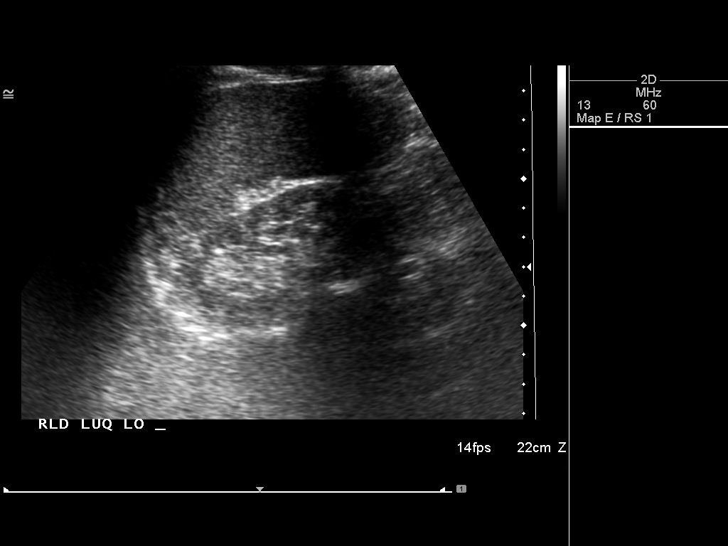
[im 65/71]
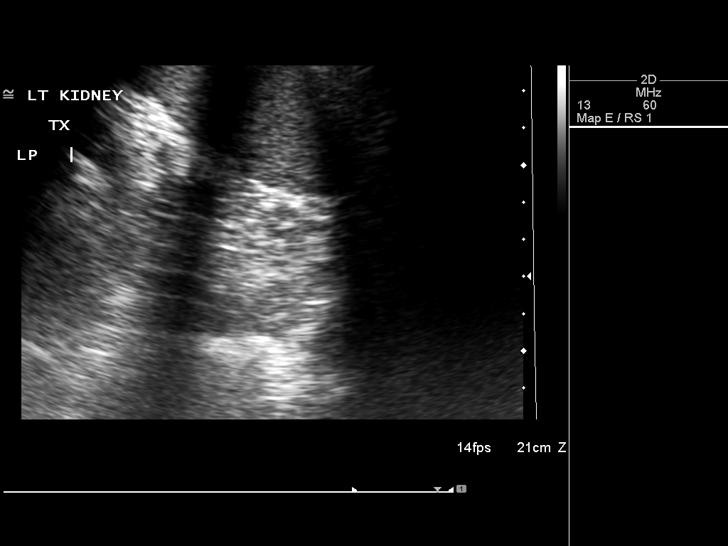
[im 71/71]
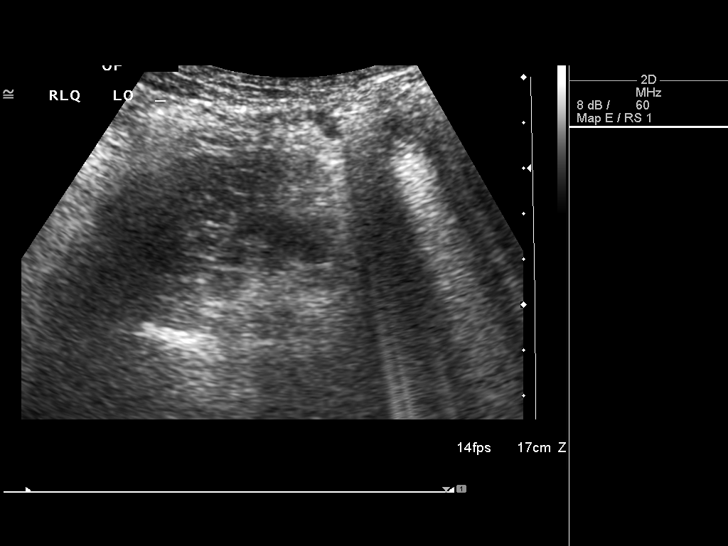

[13 of 25 positions shown; findings below may reference images not displayed]

FINDINGS: Gallbladder: The gallbladder is adequately distended. The wall is
mildly thickened at 3.5 mm. This is not a new finding. No stones or
polyps are observed. There is no pericholecystic fluid or positive
sonographic Murphy's sign.

Common bile duct: Diameter: 3.3 mm

Liver: The hepatic echotexture is mildly increased diffusely. There
is no focal mass nor ductal dilation.

IVC: No abnormality visualized.

Pancreas: Visualized portion unremarkable.

Spleen: Size and appearance within normal limits.

Right Kidney: Length: 5.6 cm. The right kidney is atrophic and
hyperechoic. No hydronephrosis is observed. There is a tiny cyst in
the midpole measuring 7 mm in diameter.

Left Kidney: Length: 6.6 cm. The renal cortical echotexture is
increased similar to that on the right. There is no hydronephrosis.

Abdominal aorta: No aneurysm visualized.

Other findings: Evaluation of the right lower quadrant did not
reveal a normal or abnormal appendix. No cystic or solid mass was
observed.
IMPRESSION: 1. Mild chronic gallbladder with wall thickening. No evidence of
stones or evidence of acute cholecystitis. If chronic cholecystitis
is suspected clinically, a nuclear medicine hepatobiliary scan with
gallbladder ejection fraction determination may be useful.
2. Atrophic hyperechoic kidneys consistent with known dialysis
dependent end-stage renal disease.

## 2019-05-20 ENCOUNTER — Ambulatory Visit: Payer: Medicaid Other

## 2019-05-27 ENCOUNTER — Other Ambulatory Visit: Payer: Self-pay

## 2019-05-27 ENCOUNTER — Ambulatory Visit (INDEPENDENT_AMBULATORY_CARE_PROVIDER_SITE_OTHER): Payer: Medicaid Other | Admitting: Family Medicine

## 2019-05-27 VITALS — BP 126/78 | HR 75 | Wt 116.8 lb

## 2019-05-27 DIAGNOSIS — H9193 Unspecified hearing loss, bilateral: Secondary | ICD-10-CM

## 2019-05-27 DIAGNOSIS — N186 End stage renal disease: Secondary | ICD-10-CM

## 2019-05-27 DIAGNOSIS — R4189 Other symptoms and signs involving cognitive functions and awareness: Secondary | ICD-10-CM
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
## 2019-05-27 NOTE — Patient Instructions (Signed)
Thank you for coming in to see [place] today! Please see below to review our plan for today's visit:  We will give you a call with the results!  Please call the clinic at [phone] if your symptoms worsen or you have any concerns. It was our pleasure to serve you!  Dr. Milus Banister [facility]

## 2019-06-24 ENCOUNTER — Encounter: Payer: Self-pay | Admitting: Family Medicine

## 2019-06-24 ENCOUNTER — Ambulatory Visit (INDEPENDENT_AMBULATORY_CARE_PROVIDER_SITE_OTHER): Payer: Medicaid Other | Admitting: Family Medicine

## 2019-06-24 ENCOUNTER — Other Ambulatory Visit: Payer: Self-pay

## 2019-06-24 VITALS — BP 140/98

## 2019-06-24 DIAGNOSIS — R4189 Other symptoms and signs involving cognitive functions and awareness: Secondary | ICD-10-CM
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
## 2019-09-23 ENCOUNTER — Encounter: Payer: Self-pay | Admitting: Internal Medicine

## 2019-09-23 ENCOUNTER — Ambulatory Visit (INDEPENDENT_AMBULATORY_CARE_PROVIDER_SITE_OTHER): Payer: Medicaid Other | Admitting: Internal Medicine

## 2019-09-23 VITALS — BP 100/62 | HR 94 | Ht 66.0 in | Wt 112.1 lb

## 2019-09-23 DIAGNOSIS — Z86010 Personal history of colonic polyps: Secondary | ICD-10-CM | POA: Diagnosis not present

## 2019-09-23 DIAGNOSIS — R634 Abnormal weight loss: Secondary | ICD-10-CM | POA: Diagnosis not present

## 2019-09-23 DIAGNOSIS — D179 Benign lipomatous neoplasm, unspecified: Secondary | ICD-10-CM | POA: Diagnosis not present

## 2019-09-23 DIAGNOSIS — R6881 Early satiety: Secondary | ICD-10-CM
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
## 2019-09-23 NOTE — Patient Instructions (Signed)
  You have been scheduled for an endoscopy. Please follow written instructions given to you at your visit today. If you use inhalers (even only as needed), please bring them with you on the day of your procedure.   I appreciate the opportunity to care for you. Carl Gessner, MD, FACG 

## 2019-10-21 ENCOUNTER — Other Ambulatory Visit: Payer: Self-pay

## 2019-10-21 ENCOUNTER — Encounter: Payer: Self-pay | Admitting: Internal Medicine

## 2019-10-21 ENCOUNTER — Ambulatory Visit (AMBULATORY_SURGERY_CENTER): Payer: Medicaid Other | Admitting: Internal Medicine

## 2019-10-21 VITALS — BP 115/70 | HR 63 | Temp 98.0°F | Resp 14 | Ht 66.0 in | Wt 112.0 lb

## 2019-10-21 DIAGNOSIS — R6881 Early satiety: Secondary | ICD-10-CM

## 2019-10-21 DIAGNOSIS — K2950 Unspecified chronic gastritis without bleeding: Secondary | ICD-10-CM

## 2019-10-21 DIAGNOSIS — K319 Disease of stomach and duodenum, unspecified: Secondary | ICD-10-CM

## 2019-10-21 DIAGNOSIS — K2970 Gastritis, unspecified, without bleeding: Secondary | ICD-10-CM | POA: Diagnosis not present

## 2019-10-21 DIAGNOSIS — R634 Abnormal weight loss: Secondary | ICD-10-CM

## 2019-10-21 HISTORY — PX: UPPER GASTROINTESTINAL ENDOSCOPY: SHX188

## 2019-10-21 MED ORDER — SODIUM CHLORIDE 0.9 % IV SOLN
500.0000 mL | Freq: Once | INTRAVENOUS | Status: DC
Start: 2019-10-21 — End: 2019-10-21

## 2019-10-21 NOTE — Progress Notes (Signed)
Vital signs checked by:CW  The medical and surgical history was reviewed and verified with the patient.  Video interpreter used today at the [facility] for this pt.  Interpreter's name is-Crystal [phone]

## 2019-10-21 NOTE — Progress Notes (Signed)
To PACU, VSS. Report to [email or web address].tb 

## 2019-10-21 NOTE — Op Note (Signed)
[facility] Patient Name: Keith Vaughan Procedure Date: 10/21/2019 3:32 PM MRN: [id number] Endoscopist: [name] , MD Age: 54 Referring MD:  Date of Birth: 12-10-1965 Gender: Male Account #: [id number] Procedure:                Upper GI endoscopy Indications:              Early satiety, Weight loss Medicines:                Propofol per Anesthesia, Monitored Anesthesia Care Procedure:                Pre-Anesthesia Assessment:                           - Prior to the procedure, a History and Physical                            was performed, and patient medications and                            allergies were reviewed. The patient's tolerance of                            previous anesthesia was also reviewed. The risks                            and benefits of the procedure and the sedation                            options and risks were discussed with the patient.                            All questions were answered, and informed consent                            was obtained. Prior Anticoagulants: The patient has                            taken [place] previous anticoagulant or antiplatelet                            agents. ASA Grade Assessment: II - A patient with                            mild systemic disease. After reviewing the risks                            and benefits, the patient was deemed in                            satisfactory condition to undergo the procedure.                           After obtaining informed consent, the endoscope was  passed under direct vision. Throughout the                            procedure, the patient's blood pressure, pulse, and                            oxygen saturations were monitored continuously. The                            Endoscope was introduced through the mouth, and                            advanced to the second part of duodenum. The upper                            GI endoscopy  was accomplished without difficulty.                            The patient tolerated the procedure well. Scope In: Scope Out: Findings:                 Diffuse moderate inflammation characterized by                            congestion (edema), erythema pale colr and mucus                            was found in the gastric body and in the gastric                            antrum. Biopsies were taken with a cold forceps for                            histology. Verification of patient identification                            for the specimen was done. Estimated blood loss was                            minimal.                           The exam was otherwise without abnormality.                           The cardia and gastric fundus were normal on                            retroflexion. Complications:            [place] immediate complications. Estimated Blood Loss:     Estimated blood loss was minimal. Impression:               - Gastritis. Biopsied.                           -  The examination was otherwise normal. Recommendation:           - Patient has a contact number available for                            emergencies. The signs and symptoms of potential                            delayed complications were discussed with the                            patient. Return to normal activities tomorrow.                            Written discharge instructions were provided to the                            patient.                           - Resume previous diet.                           - Continue present medications.                           - Await pathology results.                           - Limited to [place] so will likely need                            interpreter and an office visit to review results                            (in person) [name], MD 10/21/2019 4:06:56 PM This report has been signed electronically.

## 2019-10-21 NOTE — Progress Notes (Signed)
Patient in recovery now.  Virtual interpreter name is Manuela Schwartz # [phone]

## 2019-10-21 NOTE — Progress Notes (Signed)
Called to room to assist during endoscopic procedure.  Patient ID and intended procedure confirmed with present staff. Received instructions for my participation in the procedure from the performing physician.  

## 2019-10-21 NOTE — Patient Instructions (Addendum)
YOU HAD AN ENDOSCOPIC PROCEDURE TODAY AT THE [facility]:   Refer to the procedure report that was given to you for any specific questions about what was found during the examination.  If the procedure report does not answer your questions, please call your gastroenterologist to clarify.  If you requested that your care partner not be given the details of your procedure findings, then the procedure report has been included in a sealed envelope for you to review at your convenience later.  YOU SHOULD EXPECT: Some feelings of bloating in the abdomen. Passage of more gas than usual.  Walking can help get rid of the air that was put into your GI tract during the procedure and reduce the bloating. If you had a lower endoscopy (such as a colonoscopy or flexible sigmoidoscopy) you may notice spotting of blood in your stool or on the toilet paper. If you underwent a bowel prep for your procedure, you may not have a normal bowel movement for a few days.  Please Note:  You might notice some irritation and congestion in your nose or some drainage.  This is from the oxygen used during your procedure.  There is [place] need for concern and it should clear up in a day or so.  SYMPTOMS TO REPORT IMMEDIATELY:    Following upper endoscopy (EGD)  Vomiting of blood or coffee ground material  New chest pain or pain under the shoulder blades  Painful or persistently difficult swallowing  New shortness of breath  Fever of 100F or higher  Black, tarry-looking stools  For urgent or emergent issues, a gastroenterologist can be reached at any hour by calling [phone]. Do not use MyChart messaging for urgent concerns.    DIET:  We do recommend a small meal at first, but then you may proceed to your regular diet.  Drink plenty of fluids but you should avoid alcoholic beverages for 24 hours.  ACTIVITY:  You should plan to take it easy for the rest of today and you should NOT DRIVE or use heavy machinery  until tomorrow (because of the sedation medicines used during the test).    FOLLOW UP: Our staff will call the number listed on your records 48-72 hours following your procedure to check on you and address any questions or concerns that you may have regarding the information given to you following your procedure. If we do not reach you, we will leave a message.  We will attempt to reach you two times.  During this call, we will ask if you have developed any symptoms of COVID 19. If you develop any symptoms (ie: fever, flu-like symptoms, shortness of breath, cough etc.) before then, please call [phone].  If you test positive for Covid 19 in the 2 weeks post procedure, please call and report this information to us.    If any biopsies were taken you will be contacted by phone or by letter within the next 1-3 weeks.  Please call us at [phone] if you have not heard about the biopsies in 3 weeks.    SIGNATURES/CONFIDENTIALITY: You and/or your care partner have signed paperwork which will be entered into your electronic medical record.  These signatures attest to the fact that that the information above on your After Visit Summary has been reviewed and is understood.  Full responsibility of the confidentiality of this discharge information lies with you and/or your care-partner. 

## 2019-10-23 ENCOUNTER — Telehealth: Payer: Self-pay

## 2019-10-23 NOTE — Telephone Encounter (Signed)
Unable to leave message

## 2019-10-28 ENCOUNTER — Encounter: Payer: Self-pay | Admitting: Internal Medicine

## 2019-10-29 ENCOUNTER — Encounter: Payer: Self-pay | Admitting: *Deleted

## 2020-06-30 ENCOUNTER — Ambulatory Visit (HOSPITAL_COMMUNITY): Payer: Self-pay | Admitting: Licensed Clinical Social Worker

## 2020-09-28 ENCOUNTER — Ambulatory Visit (HOSPITAL_COMMUNITY): Payer: Self-pay | Admitting: Licensed Clinical Social Worker

## 2020-10-29 ENCOUNTER — Ambulatory Visit (HOSPITAL_COMMUNITY): Payer: Medicaid Other | Admitting: Licensed Clinical Social Worker

## 2020-11-02 ENCOUNTER — Other Ambulatory Visit: Payer: Self-pay

## 2020-11-02 ENCOUNTER — Ambulatory Visit (HOSPITAL_COMMUNITY): Payer: Medicaid Other | Admitting: Licensed Clinical Social Worker

## 2020-11-02 DIAGNOSIS — F331 Major depressive disorder, recurrent, moderate: Secondary | ICD-10-CM

## 2020-11-02 NOTE — Progress Notes (Signed)
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 Hx of cancer and stomach problems.    Visit Diagnosis: Moderate Major depression  Client is a 55 year old male. Client is referred by insurance  for a depression.   Client states mental health symptoms as evidenced by:     Depression Sleep (too much or little); Difficulty Concentrating; Fatigue; Hopelessness; Increase/decrease in appetite; Worthlessness; Tearfulness Sleep (too much or little); Difficulty Concentrating; Fatigue; Hopelessness; Increase/decrease in appetite; Worthlessness; Tearfulness  Duration of Depressive Symptoms Greater than two weeks Greater than two weeks  Mania None None  Anxiety Worrying; Tension Worrying; Tension   Client denies suicidal and homicidal ideations at this time  Client denies hallucinations and delusions at this time   Client was screened for the following SDOH: Depression  Assessment Information that integrates subjective and objective details with a therapist's professional interpretation:     Patient was alert and oriented x5.  He presented with depressed and anxious mood\affect.  Mikell cooperative and maintained good eye contact.  Primary stressor for patient as illness.  He reports history of dialysis, history of cancer, and stomach problems.  He reports that he is a refugee from [place] and came to the [place] 7 years ago the reason he came to the [place] was for proper medical treatment as he was not getting that for his kidney failure and [place].  Patient reports an increase in depression since coming to the [place].  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  Patient would like to be able  to discussed his illness with LCSW and get education on medication management.   Patient reports some reluctance for medication management as he is on a lot of medications for his dialysis but was open to a medication appointment for education purposes and to see if medication is appropriate for him.  Patient also agreeable to see LCSW 1 time monthly.  Patient did come in 15 minutes late to appointment LCSW explained to patient the importance of being on time and showing up within 10 minutes of appointment.  If patient shows up after 10 minutes of an appointment it is considered a Krotz call Freda-show and appointments will be canceled moving forward.  LCSW explained that with the need for interpreter patient should attempt to show up 5 minutes early for appointments but [place] later than 10 minutes late  Client meets criteria for: Major depression    Treatment recommendations are include plan: Patient wants to discuss his illness for dialysis, history of cancer, and stomach problems that are feeding into his depression to decrease his PHQ-9 below 10  Goals: Elevate mood and show evidence of usual energy, activities, and socialization level.; Develop healthy interpersonal relationships that lead to alleviation and help prevent the relapse of depression symptoms; Develop the ability to recognize, accept, and cope with feelings of depression; Alleviate depressed mood and return to previous level of effective functioning. Verbally identify, if possible, the source of depressed mood; Discuss the nature of the relationship with the deceased significant other, reminiscing about a time spent together; Discuss overreliance on significant other for support, direction, and meaning to life.; Verbally express understanding of the relationship between depressed mood and repression of  feelings - such as anger, hurt, and sadness; Verbalize any unresolved grief issues that may be contributing to depression; Participate in social  contacts and initiate communication of needs and desires; Learn and implement calming skills to reduce overall tension and moments of increased anxiety, attention, or arousal; Learn and implement personal skills for managing stress, solving daily problems, and resolving conflicts effectively  Objectives:  PHQ-9 below 10, GAD-7 below 10, walk 3 x weekly, 3 postive affirmation weekly.    Clinician assisted client with scheduling the following appointments: 2 weeks  Clinician details of appointment.    Client was in agreement with treatment recommendations.    Patient Reported Information  What Do You Feel Would Help You the Most Today? Treatment for Depression or other mood problem; Medication(s)   Have You Recently Had Any Thoughts About Hurting Yourself? Yes  Are You Planning to Commit Suicide/Harm Yourself At This time? [place]   Have you Recently Had Thoughts About [place]? [place]  Are You Planning to Harm Someone at This Time? [place]   Have You Used Any Alcohol or Drugs in the Past 24 Hours? [place]   Do You Currently Have a Therapist/Psychiatrist? [place]    Have You Been Recently Discharged From Any Office Practice or Programs? [place]    CCA Screening Triage Referral Assessment Type of Contact: Face-to-Face  Location of Assessment: [facility]  Provider Location: [facility]    Is CPS involved or ever been involved? Never  Is APS involved or ever been involved? Never   Patient Determined To Be At Risk for Harm To Self or Others Based on Review of Patient Reported Information or Presenting Complaint? Masri   [place] of Residence: [place]   Patient Currently Receiving the Following Services: [place] data recorded  Determination of Need: [place] data recorded  Options For Referral: [place] data recorded  Discharge Disposition:     Dory Horn, LCSW

## 2020-11-16 ENCOUNTER — Other Ambulatory Visit: Payer: Self-pay

## 2020-11-16 ENCOUNTER — Ambulatory Visit (INDEPENDENT_AMBULATORY_CARE_PROVIDER_SITE_OTHER): Payer: Medicaid Other | Admitting: Licensed Clinical Social Worker

## 2020-11-16 DIAGNOSIS — F331 Major depressive disorder, recurrent, moderate: Secondary | ICD-10-CM | POA: Diagnosis not present

## 2020-11-16 NOTE — Progress Notes (Signed)
   THERAPIST PROGRESS NOTE  Session Time: 30  Participation Level: Active  Behavioral Response: CasualAlertAnxious and Depressed  Type of Therapy: Individual Therapy  Treatment Goals addressed: Anxiety and Communication: depression   Interventions: CBT  Summary: Keith Vaughan is a 55 y.o. male who presents with depressed and anxious mood\affect.  Patient was alert and oriented x5.  He was pleasant cooperative and maintained good eye contact in session.  LCSW utilized the use of interpreter as patient only speaks [place]. Me. Darnelle states that his primary stressors is lumps that he has in his lower back.  He reports that he is anxious and tense about following up with a doctor.  LCSW used encouraging, and advice giving in today's session.  For establishing himself with a primary care physician.  LCSW looked in the patient's chart and patient was established with family medicine department at [facility].  LCSW inquired if it would be okay to call on the behalf of patient and patient was agreeable.  LCSW in session called family medicine department to notify primary care physician appointment of patient's stress\tension of a referral to hematologist oncologist.  Family medicine department was agreeable to see patient in September 15 at 3:10 PM.  Patient was notified of that time and was agreeable to see that provider.  You did inquire if there is anything else that patient wanted to talk about today.  Patient stated "Keith Vaughan".  LCSW inquired about his thoughts and feelings about continuing therapy.  Patient denied the need for therapy at this time.  LCSW did leave door open for patient to be reassessed at a later date if needed.   Suicidal/Homicidal: Nowithout intent/plan  Therapist Response:    Intervention/Plan: Solution focused therapy was utilized in today's session.  As evidenced by note written above about resources to family medicine department and connecting patient with primary care physician.   Patient states Mangan further goals and objectives at this time.  Plan: Return again in 4  weeks.      Dory Horn, LCSW 11/16/2020

## 2020-11-24 ENCOUNTER — Ambulatory Visit (HOSPITAL_COMMUNITY): Payer: Medicaid Other | Admitting: Licensed Clinical Social Worker

## 2020-12-02 ENCOUNTER — Encounter: Payer: Self-pay | Admitting: Family Medicine

## 2020-12-02 ENCOUNTER — Other Ambulatory Visit: Payer: Self-pay

## 2020-12-02 ENCOUNTER — Ambulatory Visit (INDEPENDENT_AMBULATORY_CARE_PROVIDER_SITE_OTHER): Payer: Medicaid Other | Admitting: Family Medicine

## 2020-12-02 VITALS — BP 131/82 | HR 93 | Wt 123.8 lb

## 2020-12-02 DIAGNOSIS — L729 Follicular cyst of the skin and subcutaneous tissue, unspecified: Secondary | ICD-10-CM | POA: Diagnosis not present

## 2020-12-02 DIAGNOSIS — N186 End stage renal disease: Secondary | ICD-10-CM | POA: Diagnosis not present

## 2020-12-02 DIAGNOSIS — R1032 Left lower quadrant pain: Secondary | ICD-10-CM | POA: Diagnosis not present

## 2020-12-02 DIAGNOSIS — Z992 Dependence on renal dialysis: Secondary | ICD-10-CM | POA: Diagnosis not present

## 2020-12-02 DIAGNOSIS — K5909 Other constipation: Secondary | ICD-10-CM | POA: Diagnosis not present

## 2020-12-02 DIAGNOSIS — L0591 Pilonidal cyst without abscess: Secondary | ICD-10-CM | POA: Diagnosis not present

## 2020-12-02 DIAGNOSIS — I10 Essential (primary) hypertension: Secondary | ICD-10-CM

## 2020-12-02 DIAGNOSIS — K30 Functional dyspepsia: Secondary | ICD-10-CM | POA: Diagnosis not present

## 2020-12-02 DIAGNOSIS — R109 Unspecified abdominal pain: Secondary | ICD-10-CM | POA: Insufficient documentation

## 2020-12-02 MED ORDER — DICLOFENAC SODIUM 1 % EX GEL: 4.0000 g | Freq: Four times a day (QID) | CUTANEOUS | 0 refills | Status: DC

## 2020-12-02 NOTE — Progress Notes (Addendum)
SUBJECTIVE:   CHIEF COMPLAINT / HPI:   Keith Vaughan presents for follow up for several ongoing health concerns  Gluteal mass - He has noticed a mass near his gluteus. It appeared a year ago and was the size of a pimple but has been growing steadily. It is not painful but itches and burns. It is particularly irritated when he sits down. He is concerned it is cancer. Denies blood in stools, fluctuance of mass, pain when defecating, fever, nausea and vomiting.  Colonoscopy conducted in 2019 revealed benign polyps that were removed. He was told to return in 5 years.  Constipation - He has a history of chronic constipation for which he takes daily stool softener. Unclear which brand. Inquired whether he can stop taking stool softener but clarified that he cannot defecate without taking softener. Discussed options and possible complications should he not be constipated for more than a few days.  Abdominal pain - Endorses focal abdominal pain in lower left quadrant that is intermittent. It gets worse when he lays on his left side when he sleeps and has not noted anything that makes it better. There is Bearce connection with food intake or bowel movements. He is able to urinate and defecate normally. Petrosino change to consistency/color of stools or color or urine.  Indigestion - Currently taking 20mg  omeprazole daily but feels indigestion after he eats. He has had "fullness in belly" for a year. Per chart review has had endoscopy with in august 2021 which revealed inflammation and congestion in the gastric body that was biopsied for suspected gastritis. Follow-up results were attempted in August of 2021 but they were unable to leave a message and it does not appear [facility] has been contacted since.   Hypertension - Blood pressure was 131/82 in office today.  A certified interpreter was called into the visit.  PERTINENT  PMH / PSH:  Hypertension ESRD on dialysis Depression  OBJECTIVE:   BP 131/82   Pulse 93    Wt 123 lb 12.8 oz (56.2 kg)   SpO2 100%   BMI 19.98 kg/m   GEN: Well appearing, in Starace acute distress, alert and oriented PULM: Clear to auscultation, normal work of breathing CARD: RRR, Beyl murmurs or gallops GI: Soft, nontender, nondistended. Peary rebound or guarding. Focal pain to palpation on left iliac crest. Reproduced along iliac crest and when pressing on patient's left flank. MSK: Full ROM. 5/5 strength on lower extremities. Upper extremities not examined. NEUR: Grossly normal. GU: 2 cysts noted. 1 pilonidal cyst (4 in width x 2 in height).1 left gluteal cheek cyst (~2in height x 2 in radius) oriented 3 o'clock from the rectum but not involving rectum. Noa erythema or induration.  ASSESSMENT/PLAN:   Pilonidal cyst Discussed with Geoge that size and location of cysts will require surgery and cannot be done as an outpatient procedure in this clinic. Sending referral to general surgery.  Reviewed with Lamontae that he has a right to an interpreter when he receives any call for medical needs. Will inform PCP to follow-up at next visit, given he has previously found it challenging to access care via telephone.    ESRD (end stage renal disease) on dialysis Following with dialysis 3x a week.   HTN (hypertension) Well controlled on losartan 100mg  daily.  Constipation, chronic Taking stool softener daily. Unable to defecate without taking softener but would like to come off it. Follow-up at next visit with PCP regarding alternative medications.  Indigestion Still experiencing epigastric pain on  omeprazole 20mg  daily. Increasing dose to omeprazole 40mg  daily.     [place]    I was personally present and performed or re-performed the history, physical exam and medical decision making activities of this service and have verified that the service and findings are accurately documented in the student's note.  Shary Key,  DO                  12/03/2020, 11:22 PM

## 2020-12-02 NOTE — Assessment & Plan Note (Signed)
Well controlled on losartan 100mg  daily.

## 2020-12-02 NOTE — Assessment & Plan Note (Signed)
Following with dialysis 3x a week.

## 2020-12-02 NOTE — Assessment & Plan Note (Addendum)
Discussed with Scout that size and location of cysts will require surgery and cannot be done as an outpatient procedure in this clinic. Sending referral to general surgery.  Reviewed with Yitzchak that he has a right to an interpreter when he receives any call for medical needs. Will inform PCP to follow-up at next visit, given he has previously found it challenging to access care via telephone.

## 2020-12-02 NOTE — Assessment & Plan Note (Signed)
Taking stool softener daily. Unable to defecate without taking softener but would like to come off it. Follow-up at next visit with PCP regarding alternative medications.

## 2020-12-02 NOTE — Patient Instructions (Addendum)
It was great seeing you today!  Today you came in for left sided pelvic pain, bump on your bottom, and worsening indegestion.   For your pelvic pain we are prescribing a topical voltaren gel to be used twice a day as needed for the pain. If this does not help, let us know at your follow up and we can get an X ray of your hip.   For the cysts we have placed a referral to surgery for removal. They will call you to schedule an appointment.   For your acid reflux we are going to increase your Omeprazole from 20mg  to 40mg  daily.   Please check-out at the front desk before leaving the clinic. Schedule to see your PCP in about a month to follow up on these concerns, but if you need to be seen earlier than that for any new issues we're happy to fit you in, just give [place] a call!  Feel free to call with any questions or concerns at any time, at [phone].   Take care,  Dr. Shary Key [facility]    ????? ??????? ?????????????????  ???????? ?????? ??????????? ?????????????? ???????????? ???????????? ??????????????????????? ???????????? ???????????????? ????????????????  ???? ?????????????? ????????????????????? ??????????????? ?????????????? ?????????? ??????????? ????????? ??????????? voltaren gel ??? ??????????????????  cysts ?????????????????????????????????????????????????????????????????????????????????? ?????????????? ??????? ??????????????  ????????????????????????? ??????????????? ???? Omeprazole ??? 20mg  ?? 40mg  ???? ??????????????????????????  ????????? ?????????? ???????? ?????????????? ??????????? ????????? ??????????????????? ??????????????????????? ???????????????? ???? PCP ???????????????????????? ???????? ??????????????? ?????????????????????? ????????????????? ??????????????????????????????????????????? ?????????????????????????????  ??????????????????? ????????? ????????????????????????????? [phone] ???? ??????????? ???????????????????????    ??????????, ??????? [place]

## 2020-12-02 NOTE — Assessment & Plan Note (Signed)
Still experiencing epigastric pain on omeprazole 20mg  daily. Increasing dose to omeprazole 40mg  daily.

## 2021-01-06 ENCOUNTER — Encounter: Payer: Self-pay | Admitting: Family Medicine

## 2021-01-06 ENCOUNTER — Ambulatory Visit (INDEPENDENT_AMBULATORY_CARE_PROVIDER_SITE_OTHER): Payer: Medicaid Other | Admitting: Family Medicine

## 2021-01-06 DIAGNOSIS — Z91199 Patient's noncompliance with other medical treatment and regimen due to unspecified reason: Secondary | ICD-10-CM | POA: Insufficient documentation

## 2021-01-06 NOTE — Progress Notes (Signed)
Glatt show for appt on 01/06/21

## 2021-01-06 NOTE — Assessment & Plan Note (Signed)
Hieronymus show for appt 01/06/21

## 2021-01-19 ENCOUNTER — Ambulatory Visit (HOSPITAL_COMMUNITY): Payer: Medicaid Other | Admitting: Physician Assistant

## 2021-01-20 ENCOUNTER — Ambulatory Visit (HOSPITAL_COMMUNITY): Payer: Medicaid Other | Admitting: Physician Assistant

## 2021-01-20 ENCOUNTER — Other Ambulatory Visit: Payer: Self-pay

## 2021-02-24 ENCOUNTER — Other Ambulatory Visit: Payer: Self-pay

## 2021-02-24 ENCOUNTER — Ambulatory Visit (INDEPENDENT_AMBULATORY_CARE_PROVIDER_SITE_OTHER): Payer: Medicaid Other | Admitting: Student in an Organized Health Care Education/Training Program

## 2021-02-24 ENCOUNTER — Encounter (HOSPITAL_COMMUNITY): Payer: Self-pay | Admitting: Student in an Organized Health Care Education/Training Program

## 2021-02-24 VITALS — BP 117/82 | HR 75 | Ht 65.0 in | Wt 124.0 lb

## 2021-02-24 DIAGNOSIS — F331 Major depressive disorder, recurrent, moderate: Secondary | ICD-10-CM

## 2021-02-24 MED ORDER — MIRTAZAPINE 7.5 MG PO TABS: 7.5000 mg | ORAL_TABLET | Freq: Every day | ORAL | 0 refills | Status: DC

## 2021-02-24 NOTE — Progress Notes (Signed)
Psychiatric Initial Adult Assessment   Patient Identification: Keith Vaughan MRN:  [id number] Date of Evaluation:  02/24/2021 Referral Source: Shary Key, MD Chief Complaint:   Chief Complaint   Medication Management    Visit Diagnosis:    ICD-10-CM   1. MDD (major depressive disorder), recurrent episode, moderate (HCC)  F33.1 mirtazapine (REMERON) 7.5 MG tablet      History of Present Illness:   Keith Vaughan is a 55 yr old male who presents to establish care and for medication management.  He has Witts prior psychiatric history.  He currently receives Dialysis 3 days a week.  Patient speaks Burmese so a Optometrist was used.  He reports that his symptoms started about 4 yrs ago.  He states that he came to the [place] from [place] due to his Kidney disease.  He states that he is very isolated due to his language barrier.  He reports that he lives alone and has Steuber one to interact with.  He reports that his medical issues are also causing his depression.  He reports that in 2019 he was on the transplant list but that due to not having any support his transplant was canceled.  He reports the following depressive symptoms: Depressed mood, anhedonia, insomnia, fatigue, feelings of worthlessness/guilt, hopelessness, and decreased appetite.  He reports Golphin manic symptoms.  He reports Raneri anxiety symptoms.  He reports Mcclaran psychotic symptoms.  He reports Longo history of abuse.  He reports that he lives alone.  He reports smoking about half a pack a day.  He reports Shugrue alcohol use.  He reports Hyson illicit substance use.  He reports that due to his health concerns he does not have a job.  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  Discussed with him starting an antidepressant and initially he was not interested.  Upon further questioning of why he was interested in medication he stated that he already takes a lot of pills and does not want them to interact with  each other.  Discussed that a common medication used in patients who are on dialysis is Remeron.  Discussed that since sleep is such an issue with him is also a good medication choice as it does help with sleep.  Discussed the risks and benefits of starting medication he was agreeable to it.  He reports Hillyard SI, HI, or AVH.  He reports that he currently has a little bit of pain from where a lipoma was recently removed.  He does report his chronic constipation is still an issue.  Discussed that I would see him back in about 2 weeks and he was agreeable to this.  He reports Estupinan other concerns at present.   Associated Signs/Symptoms: Depression Symptoms:  depressed mood, anhedonia, insomnia, fatigue, feelings of worthlessness/guilt, hopelessness, loss of energy/fatigue, decreased appetite, (Hypo) Manic Symptoms:   Reports None Anxiety Symptoms:   Reports None Psychotic Symptoms:   Reports None PTSD Symptoms: Negative  Past Psychiatric History: None  Previous Psychotropic Medications: Challis   Substance Abuse History in the last 12 months:  Toste.  Consequences of Substance Abuse: Negative  Past Medical History:  Past Medical History:  Diagnosis Date   Acne    on bottom   Anemia    Anxiety    at times   Depression    at times   Dialysis patient [facility])    Has dialysis MON, WED,FRI.   ESRD on dialysis [facility])  09/30/2013   ESRF (end stage renal failure) (HCC)    Hearing loss    Bil hearing aids   Hx of adenomatous polyp of colon 06/22/2017   Hypertension    Irregular heart beat    per interpretor/heart beats fast at times/ Miskell SOB or chest pain.   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; CXR negative; asymptomatic;     Past Surgical History:  Procedure Laterality Date   AV FISTULA PLACEMENT Left 10/01/2013   Procedure: RADIOCEPHALIC ARTERIOVENOUS (AV)  FISTULA CREATION LEFT ARM;  Surgeon: Mal Misty, MD;  Location: [facility] OR;  Service: Vascular;  Laterality: Left;   INSERTION OF DIALYSIS CATHETER Right 10/01/2013   Procedure: INSERTION OF DIALYSIS CATHETER- Right Internal Jugular;  Surgeon: Mal Misty, MD;  Location: [place];  Service: Vascular;  Laterality: Right;   leg trauma Bilateral    debridement   LIPOMA EXCISION Left 2014   left thigh lipoma   UPPER GASTROINTESTINAL ENDOSCOPY  10/21/2019    Family Psychiatric History: Reports None  Family History:  Family History  Problem Relation Age of Onset   Colon cancer Neg Hx    Esophageal cancer Neg Hx    Rectal cancer Neg Hx    Stomach cancer Neg Hx     Social History:   Social History   Socioeconomic History   Marital status: Single    Spouse name: Not on file   Number of children: Not on file   Years of education: Not on file   Highest education level: Not on file  Occupational History   Not on file  Tobacco Use   Smoking status: Light Smoker    Types: Cigarettes   Smokeless tobacco: Never   Tobacco comments:    smokes 2-3 cigarettes a day  Vaping Use   Vaping Use: Never used  Substance and Sexual Activity   Alcohol use: Sadiq    Alcohol/week: 0.0 standard drinks   Drug use: Glauser   Sexual activity: Not on file  Other Topics Concern   Not on file  Social History Narrative   Single, a refugee from [place]. Main language is Burmese. Limited English   Lives alone.   Christian.   Smoker of 2 to 3 cigarettes a day Lutterman drug use Chowning tobacco otherwise Michaelson alcohol   Social Determinants of Radio broadcast assistant Strain: Not on file  Food Insecurity: Not on file  Transportation Needs: Not on file  Physical Activity: Not on file  Stress: Not on file  Social Connections: Not on file    Additional Social History: Lives alone.  Currently receives Dialysis 3 days a week.  Does not have a job due to his illness.  Allergies:  Brashear Known Allergies  Metabolic Disorder  Labs: Alaimo results found for: HGBA1C, MPG Hougland results found for: PROLACTIN Bick results found for: CHOL, TRIG, HDL, CHOLHDL, VLDL, LDLCALC Pinela results found for: TSH  Therapeutic Level Labs: Boak results found for: LITHIUM Wafer results found for: CBMZ Hebel results found for: VALPROATE  Current Medications: Current Outpatient Medications  Medication Sig Dispense Refill   mirtazapine (REMERON) 7.5 MG tablet Take 1 tablet (7.5 mg total) by mouth at bedtime. 30 tablet 0   B Complex-C-Folic Acid (DIALYVITE 500 PO) Take 1 tablet by mouth daily.     cinacalcet (SENSIPAR) 60 MG  tablet Take 60 mg by mouth daily.     Dextromethorphan-Guaifenesin (MUCINEX DM) 30-600 MG TB12 Take 1 tablet by mouth 2 (two) times daily.     diclofenac Sodium (VOLTAREN) 1 % GEL Apply 4 g topically 4 (four) times daily. 50 g 0   diphenhydramine-acetaminophen (TYLENOL PM) 25-500 MG TABS tablet Take 1 tablet by mouth at bedtime as needed.     FERROUS FUMARATE PO Take 89 mg by mouth 2 (two) times daily.      lactulose (CEPHULAC) 20 g packet Take 40 g by mouth 3 (three) times daily.     lanthanum (FOSRENOL) 1000 MG chewable tablet Chew 1,000 mg by mouth 3 (three) times daily with meals.     losartan (COZAAR) 100 MG tablet Take 100 mg by mouth daily.     NON FORMULARY Take 500 mg by mouth daily. "Progesic"     omeprazole (PRILOSEC) 20 MG capsule Take 20 mg by mouth daily.     sevelamer (RENAGEL) 800 MG tablet Take 5,600 mg by mouth 3 (three) times daily with meals. Add 4 tablets by mouth with snacks     Boyadjian current facility-administered medications for this visit.    Musculoskeletal: Strength & Muscle Tone: within normal limits Gait & Station: normal Patient leans: N/A  Psychiatric Specialty Exam: Review of Systems  Respiratory:  Negative for cough and shortness of breath.   Cardiovascular:  Negative for chest pain.  Gastrointestinal:  Positive for constipation (Chronic). Negative for abdominal pain, diarrhea, nausea and  vomiting.  Musculoskeletal:        Knee and ankle pain   Neurological:  Negative for headaches.  Psychiatric/Behavioral:  Negative for agitation, hallucinations and suicidal ideas. The patient is not nervous/anxious.    Blood pressure 117/82, pulse 75, height 5\' 5"  (1.651 m), weight 124 lb (56.2 kg), SpO2 96 %.Body mass index is 20.63 kg/m.  General Appearance: Casual and Fairly Groomed  Eye Contact:  Good  Speech:  Normal Rate, interpreter used   Volume:  Normal  Mood:  Anxious and Depressed  Affect:  Congruent and Depressed  Thought Process:  Coherent and Goal Directed  Orientation:  Full (Time, Place, and Person)  Thought Content:  Logical  Suicidal Thoughts:  Gayheart  Homicidal Thoughts:  Colaizzi  Memory:  Immediate;   Fair Recent;   Fair  Judgement:  Fair  Insight:  Fair  Psychomotor Activity:  Normal  Concentration:  Concentration: Good and Attention Span: Good  Recall:  Good  Fund of Knowledge:Good  Language:  Interpreter Used  Akathisia:  Negative  Handed:  Right  AIMS (if indicated):  not done  Assets:  Desire for Improvement Resilience  ADL's:  Intact  Cognition: WNL  Sleep:  Poor   Screenings: PHQ2-9    [place] Office Visit from 12/02/2020 in [place] Counselor from 11/02/2020 in [facility] Office Visit from 05/27/2019 in [place] Office Visit from 01/19/2014 in [place]  PHQ-2 Total Score 0 4 0 0  PHQ-9 Total Score 2 15 -- --      Flowsheet Row Counselor from 11/02/2020 in [place] and Plan:  Given his symptoms he meets criteria for MDD, Moderate. As sleep is a major complaint for him and with consideration of his Dialysis I will start Remeron.  I will see him back in 2 weeks  both to ensure Gucciardo side effects but also due to his isolation.   MDD, Recurrent, Moderate: -Start  Remeron 7.5 mg QHS   Briant Cedar, MD 12/8/20223:46 PM

## 2021-03-08 ENCOUNTER — Ambulatory Visit: Payer: Medicaid Other | Admitting: Family Medicine

## 2021-03-08 ENCOUNTER — Ambulatory Visit (INDEPENDENT_AMBULATORY_CARE_PROVIDER_SITE_OTHER): Payer: Medicaid Other | Admitting: Nurse Practitioner

## 2021-03-08 ENCOUNTER — Other Ambulatory Visit: Payer: Self-pay

## 2021-03-08 ENCOUNTER — Encounter: Payer: Self-pay | Admitting: Nurse Practitioner

## 2021-03-08 DIAGNOSIS — Z7689 Persons encountering health services in other specified circumstances: Secondary | ICD-10-CM | POA: Diagnosis not present

## 2021-03-08 DIAGNOSIS — N186 End stage renal disease: Secondary | ICD-10-CM

## 2021-03-08 DIAGNOSIS — Z992 Dependence on renal dialysis: Secondary | ICD-10-CM | POA: Diagnosis not present

## 2021-03-08 NOTE — Patient Instructions (Signed)
Encounter to establish care ESRD:  Continue dialysis as scheduled  Continue current medications  Renal diet  Follow up:  Follow up with Dr. Redmond Pulling

## 2021-03-08 NOTE — Progress Notes (Signed)
Virtual Visit via Telephone Note  I connected with Keith Vaughan on 03/08/21 at 10:40 AM EST by telephone and verified that I am speaking with the correct person using two identifiers.  Location: Patient: home Provider: office   I discussed the limitations, risks, security and privacy concerns of performing an evaluation and management service by telephone and the availability of in person appointments. I also discussed with the patient that there may be a patient responsible charge related to this service. The patient expressed understanding and agreed to proceed.   History of Present Illness:  Patient presents today to establish care.  Burmese interpreter used for this visit.  Patient does have a history of hypertension, constipation, hearing loss, end-stage renal disease on dialysis 3 days/week and is followed by nephrology, anemia due to chronic disease.  Patient states that he has been seeing his nephrologist once a week during dialysis session but was told that he does need a primary care physician to manage his other conditions.  Patient states that he is currently only taking 2 of his medications that are prescribed but he does not know which 2 he is taking.  He states that 1 is a stool softener.  We discussed that he will need an in office visit and will need to bring his medications to that visit so that we can update his medication list. Denies f/c/s, n/v/d, hemoptysis, PND, chest pain or edema.      Observations/Objective:  Vitals with BMI 12/02/2020 10/21/2019 10/21/2019  Height - - -  Weight 123 lbs 13 oz - -  BMI - - -  Systolic [phone]  Diastolic 82 70 56  Pulse 93 63 67  Some encounter information is confidential and restricted. Go to Review Flowsheets activity to see all data.      Assessment and Plan:   Encounter to establish care ESRD:  Continue dialysis as scheduled  Continue current medications  Renal diet  Follow up:  Follow up with Dr. Redmond Pulling     I  discussed the assessment and treatment plan with the patient. The patient was provided an opportunity to ask questions and all were answered. The patient agreed with the plan and demonstrated an understanding of the instructions.   The patient was advised to call back or seek an in-person evaluation if the symptoms worsen or if the condition fails to improve as anticipated.  I provided 23 minutes of non-face-to-face time during this encounter.   Fenton Foy, NP

## 2021-03-27 ENCOUNTER — Encounter: Payer: Self-pay | Admitting: Internal Medicine

## 2021-04-28 ENCOUNTER — Other Ambulatory Visit: Payer: Self-pay

## 2021-04-28 ENCOUNTER — Ambulatory Visit (INDEPENDENT_AMBULATORY_CARE_PROVIDER_SITE_OTHER): Payer: Medicaid Other | Admitting: Student in an Organized Health Care Education/Training Program

## 2021-04-28 DIAGNOSIS — F331 Major depressive disorder, recurrent, moderate: Secondary | ICD-10-CM | POA: Diagnosis not present

## 2021-04-28 MED ORDER — MIRTAZAPINE 7.5 MG PO TABS: 7.5000 mg | ORAL_TABLET | Freq: Every day | ORAL | 0 refills | Status: DC

## 2021-04-28 NOTE — Progress Notes (Signed)
BH MD/PA/NP OP Progress Note  04/28/2021 1:40 PM [name]  MRN:  [id number]  Chief Complaint:  HPI:  Keith Vaughan is a 56 yr old male who presents to establish care and for medication management.  He has [place] prior psychiatric history.  He currently receives Dialysis 3 days a week.   He reports that after the last appointment when he went to pick up the medication he was told it was not ready and would be in 2 days.  He went back 2 days later and reports that he was told the prescription had expired and would need a new one.  He reports he called the clinic but never got an answer.  Discussed with him that Remeron would still be the best choice for him if he was willing to take it I will reorder it for him.  Discussed that there should not be any issues with getting this filled but if there is I would give him the number to the front desk at the clinic here so that he can call to inform us if there are any issues.  He states he would like to try the medication and would like for it to be sent.  Discussed that I would see him back in 2 to 4 weeks to assess his response to medication and he was agreeable to this.  He reports Schlottman SI, HI, or AVH.  He reports Paprocki other concerns at present.   Visit Diagnosis:    ICD-10-CM   1. MDD (major depressive disorder), recurrent episode, moderate (HCC)  F33.1 mirtazapine (REMERON) 7.5 MG tablet      Past Psychiatric History: MDD  Past Medical History:  Past Medical History:  Diagnosis Date   Acne    on bottom   Anemia    Anxiety    at times   Depression    at times   Dialysis patient [facility])    Has dialysis MON, WED,FRI.   ESRD on dialysis ([place]) 09/30/2013   ESRF (end stage renal failure) ([place])    Hearing loss    Bil hearing aids   Hx of adenomatous polyp of colon 06/22/2017   Hypertension    Irregular heart beat    per interpretor/heart beats fast at times/ [place] SOB or chest pain.   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; CXR negative; asymptomatic;     Past Surgical History:  Procedure Laterality Date   AV FISTULA PLACEMENT Left 10/01/2013   Procedure: RADIOCEPHALIC ARTERIOVENOUS (AV) FISTULA CREATION LEFT ARM;  Surgeon: Mal Misty, MD;  Location: [place];  Service: Vascular;  Laterality: Left;   INSERTION OF DIALYSIS CATHETER Right 10/01/2013   Procedure: INSERTION OF DIALYSIS CATHETER- Right Internal Jugular;  Surgeon: Mal Misty, MD;  Location: [place];  Service: Vascular;  Laterality: Right;   leg trauma Bilateral    debridement   LIPOMA EXCISION Left 2014   left thigh lipoma   UPPER GASTROINTESTINAL ENDOSCOPY  10/21/2019    Family Psychiatric History: Reports None  Family History:  Family History  Problem Relation Age of Onset   Colon cancer Neg Hx    Esophageal cancer Neg Hx    Rectal cancer Neg Hx    Stomach cancer Neg Hx     Social History:  Social History   Socioeconomic History   Marital  status: Single    Spouse name: Not on file   Number of children: Not on file   Years of education: Not on file   Highest education level: Not on file  Occupational History   Not on file  Tobacco Use   Smoking status: Light Smoker    Types: Cigarettes   Smokeless tobacco: Never   Tobacco comments:    smokes 2-3 cigarettes a day  Vaping Use   Vaping Use: Never used  Substance and Sexual Activity   Alcohol use: Lockridge    Alcohol/week: 0.0 standard drinks   Drug use: Essick   Sexual activity: Not on file  Other Topics Concern   Not on file  Social History Narrative   Single, a refugee from [place]. Main language is Burmese. Limited English   Lives alone.   Christian.   Smoker of 2 to 3 cigarettes a day Yeoman drug use Naval tobacco otherwise U[place] alcohol   Social Determinants of Radio broadcast assistant Strain: Not on file  Food Insecurity: Not on file  Transportation Needs: Not on file   Physical Activity: Not on file  Stress: Not on file  Social Connections: Not on file    Allergies: Crisanto Known Allergies  Metabolic Disorder Labs: Erekson results found for: HGBA1C, MPG [place] results found for: PROLACTIN Hahne results found for: CHOL, TRIG, HDL, CHOLHDL, VLDL, LDLCALC Lio results found for: TSH  Therapeutic Level Labs: Muha results found for: LITHIUM Torr results found for: VALPROATE Mcnerney components found for:  CBMZ  Current Medications: Current Outpatient Medications  Medication Sig Dispense Refill   B Complex-C-Folic Acid (DIALYVITE 242 PO) Take 1 tablet by mouth daily.     cinacalcet (SENSIPAR) 60 MG tablet Take 60 mg by mouth daily.     Dextromethorphan-Guaifenesin (MUCINEX DM) 30-600 MG TB12 Take 1 tablet by mouth 2 (two) times daily.     diclofenac Sodium (VOLTAREN) 1 % GEL Apply 4 g topically 4 (four) times daily. 50 g 0   diphenhydramine-acetaminophen (TYLENOL PM) 25-500 MG TABS tablet Take 1 tablet by mouth at bedtime as needed.     FERROUS FUMARATE PO Take 89 mg by mouth 2 (two) times daily.      lactulose (CEPHULAC) 20 g packet Take 40 g by mouth 3 (three) times daily.     lanthanum (FOSRENOL) 1000 MG chewable tablet Chew 1,000 mg by mouth 3 (three) times daily with meals.     losartan (COZAAR) 100 MG tablet Take 100 mg by mouth daily.     mirtazapine (REMERON) 7.5 MG tablet Take 1 tablet (7.5 mg total) by mouth at bedtime. 30 tablet 0   NON FORMULARY Take 500 mg by mouth daily. "Progesic"     omeprazole (PRILOSEC) 20 MG capsule Take 20 mg by mouth daily.     sevelamer (RENAGEL) 800 MG tablet Take 5,600 mg by mouth 3 (three) times daily with meals. Add 4 tablets by mouth with snacks     Toomey current facility-administered medications for this visit.     Musculoskeletal: Strength & Muscle Tone: within normal limits Gait & Station: normal Patient leans: N/A  Psychiatric Specialty Exam: Review of Systems  Respiratory:  Negative for cough and shortness of breath.    Cardiovascular:  Negative for chest pain.  Gastrointestinal:  Positive for constipation (chronic). Negative for abdominal pain, diarrhea, nausea and vomiting.  Neurological:  Negative for weakness and headaches.  Psychiatric/Behavioral:  Positive for dysphoric mood. Negative for hallucinations and suicidal ideas. The  patient is not nervous/anxious.    There were Shamblin vitals taken for this visit.There is Gittins height or weight on file to calculate BMI.  General Appearance: Casual and Fairly Groomed  Eye Contact:  Good  Speech:  Clear and Coherent, Normal Rate, and interpreter used  Volume:  Normal  Mood:  Dysphoric  Affect:  Congruent  Thought Process:  Coherent and Goal Directed  Orientation:  Full (Time, Place, and Person)  Thought Content: Logical   Suicidal Thoughts:  Truax  Homicidal Thoughts:  Pourciau  Memory:  Immediate;   Fair Recent;   Fair  Judgement:  Fair  Insight:  Fair  Psychomotor Activity:  Normal  Concentration:  Concentration: Good and Attention Span: Good  Recall:  Good  Fund of Knowledge: Good  Language: Good  Akathisia:  Negative  Handed:  Right  AIMS (if indicated): not done  Assets:  Communication Skills Desire for Improvement Resilience  ADL's:  Intact  Cognition: WNL  Sleep:  Fair   Screenings: PHQ2-9    [place] Office Visit from 12/02/2020 in [place] Counselor from 11/02/2020 in [facility] Office Visit from 05/27/2019 in [place] Office Visit from 01/19/2014 in [place]  PHQ-2 Total Score 0 4 0 0  PHQ-9 Total Score 2 15 -- --      Flowsheet Row Counselor from 11/02/2020 in [place] and Plan:  He continues to have issues with depression and sleep and with consideration of his dialysis Remeron would still be the best option for him.  I will send him another  prescription.  I have given him the number to the front desk here so that if there are any issues he can call the clinic directly.  I will see him back in 2 to 4 weeks.   MDD, Recurrent, Moderate: -Start Remeron 7.5 mg QHS   Briant Cedar, MD 04/28/2021, 1:40 PM

## 2021-05-10 ENCOUNTER — Ambulatory Visit (INDEPENDENT_AMBULATORY_CARE_PROVIDER_SITE_OTHER): Payer: Medicaid Other | Admitting: Internal Medicine

## 2021-05-10 VITALS — BP 110/74 | HR 82 | Ht 65.0 in | Wt 126.0 lb

## 2021-05-10 DIAGNOSIS — K5909 Other constipation: Secondary | ICD-10-CM | POA: Diagnosis not present

## 2021-05-10 DIAGNOSIS — K2950 Unspecified chronic gastritis without bleeding: Secondary | ICD-10-CM | POA: Diagnosis not present

## 2021-05-10 MED ORDER — POLYETHYLENE GLYCOL 3350 17 GM/SCOOP PO POWD: ORAL | 11 refills | Status: AC

## 2021-05-10 NOTE — Progress Notes (Signed)
Keith Vaughan 56 y.o. 1965/08/22 [id number]  Assessment & Plan:   Encounter Diagnoses  Name Primary?   Constipation, chronic Yes   Chronic gastritis with intestinal metaplasia     I have given him a prescription for MiraLAX.  When I saw the patient today I overlooked the gastritis with intestinal metaplasia and plan for repeat EGD within 1 year of the last procedure.  We will contact the patient and arrange for him to come back.  Because he is on dialysis he will need a procedure at the hospital.   Subjective:   Chief Complaint: Constipation  HPI Interpreter used for visit 56 year old Burmese man on dialysis 3 times a week here because of constipation issues.  He had been taking MiraLAX with success he did not have a PCP when he spoke to nephrology and they suggested he come to see GI for a MiraLAX refill or prescription.  Since then he has obtained a PCP as well.  Colonoscopy in 2019 with a 7 mm adenoma and a benign mucosal polyp with plans for recall colonoscopy in 2024. Wt Readings from Last 3 Encounters:  05/10/21 126 lb (57.2 kg)  12/02/20 123 lb 12.8 oz (56.2 kg)  10/21/19 112 lb (50.8 kg)   He had an EGD in August 2021 with some intestinal metaplasia and gastritis.  I had planned on repeating an EGD 1 year later.  That has not been done yet.  [place] Known Allergies Current Meds  Medication Sig   losartan (COZAAR) 100 MG tablet Take 100 mg by mouth daily.   mirtazapine (REMERON) 7.5 MG tablet Take 1 tablet (7.5 mg total) by mouth at bedtime.   Polyethylene Glycol 3350 POWD Take by mouth.   sevelamer (RENAGEL) 800 MG tablet Take 5,600 mg by mouth 3 (three) times daily with meals. Add 4 tablets by mouth with snacks   Past Medical History:  Diagnosis Date   Acne    on bottom   Anemia    Anxiety    at times   Depression    at times   Dialysis patient [facility])    Has dialysis MON, WED,FRI.   ESRD on dialysis ([place]) 09/30/2013   ESRF (end stage renal failure) ([place])     Hearing loss    Bil hearing aids   Hx of adenomatous polyp of colon 06/22/2017   Hypertension    Irregular heart beat    per interpretor/heart beats fast at times/ [place] SOB or chest pain.   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; CXR negative; asymptomatic;    Past Surgical History:  Procedure Laterality Date   AV FISTULA PLACEMENT Left 10/01/2013   Procedure: RADIOCEPHALIC ARTERIOVENOUS (AV) FISTULA CREATION LEFT ARM;  Surgeon: Mal Misty, MD;  Location: [place];  Service: Vascular;  Laterality: Left;   INSERTION OF DIALYSIS CATHETER Right 10/01/2013   Procedure: INSERTION OF DIALYSIS CATHETER- Right Internal Jugular;  Surgeon: Mal Misty, MD;  Location: [place];  Service: Vascular;  Laterality: Right;   leg trauma Bilateral    debridement   LIPOMA EXCISION Left 2014   left thigh lipoma   UPPER GASTROINTESTINAL ENDOSCOPY  10/21/2019   Social History   Social History Narrative   Single, a refugee from [place]. Main language is Burmese. Limited English   Lives alone.   Christian.   Smoker  of 2 to 3 cigarettes a day Lottman drug use [place] tobacco otherwise Scha[place] alcohol   family history is not on file.   Review of Systems  As above Objective:   Physical Exam BP 110/74    Pulse 82    Ht 5\' 5"  (1.651 m)    Wt 126 lb (57.2 kg)    SpO2 99%    BMI 20.97 kg/m

## 2021-05-10 NOTE — Patient Instructions (Addendum)
You have been given a prescription for Miralax. Take to your pharmacy.   Miralax 17g ( 1 capful)  -dissolve in at least 8 ounces of water -once daily.    Follow up as needed.  If you are age 58 or younger, your body mass index should be between 19-25. Your Body mass index is 20.97 kg/m. If this is out of the aformentioned range listed, please consider follow up with your Primary Care Provider.   ________________________________________________________  The [place] [facility] providers would like to encourage you to use [facility] to communicate with providers for non-urgent requests or questions.  Due to long hold times on the telephone, sending your provider a message by [facility] may be a faster and more efficient way to get a response.  Please allow 48 business hours for a response.  Please remember that this is for non-urgent requests.  _______________________________________________________     Thank you for choosing me and [facility].  Dr.Carl Carlean Purl

## 2021-05-13 ENCOUNTER — Encounter: Payer: Self-pay | Admitting: Internal Medicine

## 2021-05-13 ENCOUNTER — Telehealth: Payer: Self-pay

## 2021-05-13 NOTE — Telephone Encounter (Signed)
Through a Burmese interpreter: Unable to reach pt by phone: Unable able to leave voice mail

## 2021-05-16 NOTE — Telephone Encounter (Signed)
Through a Ecologist. Left message for pt to call back

## 2021-05-18 NOTE — Telephone Encounter (Signed)
Through a Ecologist. Left message for pt to call back  ?

## 2021-05-23 NOTE — Telephone Encounter (Signed)
Pt was contacted through a Burmese Interpreter: Pt did pick the phone up: The Burmese Interpreter stated that the pt kept repeating "I cant hear you". Pt was called a second time with same response.  ?

## 2021-05-26 NOTE — Telephone Encounter (Signed)
Through an Burmese Interpreter: Per Dr. Carlean Purl Request: Pt was scheduled for an office visit to discuss an Upper Endoscopy at the Hospital: Pt  scheduled on 06/16/2021 at 1:30 to see Dr. Carlean Purl. Pt made aware ?Pt verbalized understanding with all questions answered.  ? ?

## 2021-05-31 ENCOUNTER — Telehealth (HOSPITAL_COMMUNITY): Payer: Self-pay | Admitting: *Deleted

## 2021-05-31 NOTE — Telephone Encounter (Signed)
Pharmacy fax to request a new rx of mirtazapine 7.5 mg He should be out this week, and he has a future appt with Dr Kai Levins.  ?

## 2021-06-02 ENCOUNTER — Telehealth (HOSPITAL_COMMUNITY): Payer: Self-pay | Admitting: *Deleted

## 2021-06-02 ENCOUNTER — Encounter (HOSPITAL_COMMUNITY): Payer: Medicaid Other | Admitting: Student in an Organized Health Care Education/Training Program

## 2021-06-02 MED ORDER — MIRTAZAPINE 7.5 MG PO TABS: 7.5000 mg | ORAL_TABLET | Freq: Every day | ORAL | 0 refills | Status: DC

## 2021-06-02 NOTE — Addendum Note (Signed)
Addended by: Briant Cedar on: 06/02/2021 03:38 PM ? ? Modules accepted: Orders ? ?

## 2021-06-02 NOTE — Telephone Encounter (Signed)
Contact by patient's pharmacy for a refill of patients Remeron.  Refill was sent. ? ? ?-Remeron 7.5 mg QHS: 30 tablets with 0 refills sent ? ? ? ?Fatima Sanger MD ?Resident ? ?

## 2021-06-02 NOTE — Telephone Encounter (Signed)
Fax request from [facility] for patients Mirtazapine. Reviewed record and patient should be out. Will forward request to his MD. ?

## 2021-06-09 ENCOUNTER — Ambulatory Visit (INDEPENDENT_AMBULATORY_CARE_PROVIDER_SITE_OTHER): Payer: Medicaid Other | Admitting: Student in an Organized Health Care Education/Training Program

## 2021-06-09 ENCOUNTER — Other Ambulatory Visit: Payer: Self-pay

## 2021-06-09 DIAGNOSIS — F331 Major depressive disorder, recurrent, moderate: Secondary | ICD-10-CM | POA: Diagnosis not present

## 2021-06-09 MED ORDER — MIRTAZAPINE 7.5 MG PO TABS: 7.5000 mg | ORAL_TABLET | Freq: Every day | ORAL | 0 refills | Status: DC

## 2021-06-09 NOTE — Progress Notes (Signed)
BH MD/PA/NP OP Progress Note ? ?06/09/2021 5:50 PM ?Keith Vaughan  ?MRN:  [id number] ? ?Chief Complaint: [place] chief complaint on file. ? ?HPI:  ?Keith Vaughan is a 56 yr old male who presents for follow up and for medication management.  He has Level prior psychiatric history.  He currently receives Dialysis 3 days a week.  As patient is Burmese speaking a Marketing executive was used. ? ?He reports that he was able to get his medication after the previous issue with obtaining it.  He reports that he had a good response to it.  He reports his mood is improved and his sleep has improved as well.  He reports Staples issues with his Remeron.  ? ?He reports that he has had some good news recently.  He reports that he has been moved up on the kidney transplant list and should be receiving a donor kidney. ? ?He reports he has had chronic issues with constipation and asked if there was anything I could do to help him.  He reports that he has had Miralax in the past but has had to use a lot to be effective.  Discussed with him that this is an issue that needs to be addressed by his PCP and he reported understanding and [place] further questions. ? ?He reports Man SI, HI, or AVH.  He reports good sleep.  He reports his appetite is doing good.  ? ? ?Visit Diagnosis:  ?  ICD-10-CM   ?1. MDD (major depressive disorder), recurrent episode, moderate (HCC)  F33.1 mirtazapine (REMERON) 7.5 MG tablet  ?  ? ? ?Past Psychiatric History: MDD ? ?Past Medical History:  ?Past Medical History:  ?Diagnosis Date  ? Acne   ? on bottom  ? Anemia   ? Anxiety   ? at times  ? Depression   ? at times  ? Dialysis patient [facility])   ? Has dialysis MON, WED,FRI.  ? ESRD on dialysis ([place]) 09/30/2013  ? ESRF (end stage renal failure) ([place])   ? Hearing loss   ? Bil hearing aids  ? Hx of adenomatous polyp of colon 06/22/2017  ? Hypertension   ? Irregular heart beat   ? per interpretor/heart beats fast at times/ [place] SOB or chest pain.  ? Pneumonia 2015/2018  ? 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 Renal  disorder   ? S/P hemodialysis catheter insertion ([place])   ? Right Internal Jugular   ? 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  ?Past Surgical History:  ?Procedure Laterality Date  ? AV FISTULA PLACEMENT Left 10/01/2013  ? Procedure: RADIOCEPHALIC ARTERIOVENOUS (AV) FISTULA CREATION LEFT ARM;  Surgeon: Mal Misty, MD;  Location: [facility];  Service: Vascular;  Laterality: Left;  ? INSERTION OF DIALYSIS CATHETER Right 10/01/2013  ? Procedure: INSERTION OF DIALYSIS CATHETER- Right Internal Jugular;  Surgeon: Mal Misty, MD;  Location: [facility] [place];  Service: Vascular;  Laterality: Right;  ? leg trauma Bilateral   ? debridement  ? LIPOMA EXCISION Left 2014  ? left thigh lipoma  ? UPPER GASTROINTESTINAL ENDOSCOPY  10/21/2019  ? ? ?Family Psychiatric History: Reports None ? ?Family History:  ?Family History  ?Problem Relation Age of Onset  ? Colon cancer Neg Hx   ? Esophageal cancer Neg Hx   ? Rectal cancer Neg Hx   ? Stomach cancer Neg Hx   ? ? ?Social History:  ?Social History  ? ?Socioeconomic History  ? Marital status: Single  ?  Spouse name: Not on file  ? Number of children: Not on file  ? Years of education: Not on file  ? Highest education level: Not on file  ?Occupational History  ? Not on file  ?Tobacco Use  ? Smoking status: Light Smoker  ?  Types: Cigarettes  ? Smokeless tobacco: Never  ? Tobacco comments:  ?  smokes 2-3 cigarettes a day  ?Vaping Use  ? Vaping Use: Never used  ?Substance and Sexual Activity  ? Alcohol use: [place]  ?  Alcohol/week: 0.0 standard drinks  ? Drug use: Vecchiarelli  ? Sexual activity: Not on file  ?Other Topics Concern  ? Not on file  ?Social History Narrative  ? Single, a refugee from [place]. Main language is Burmese. Limited English  ? Lives alone.  ? Christian.  ? Smoker of 2 to 3 cigarettes a day [place] drug use [place] tobacco otherwise [place] alcohol  ? ?Social Determinants of Health  ? ?Financial Resource Strain: Not on file  ?Food Insecurity: Not on file  ?Transportation Needs:  Not on file  ?Physical Activity: Not on file  ?Stress: Not on file  ?Social Connections: Not on file  ? ? ?Allergies: [place] Known Allergies ? ?Metabolic Disorder Labs: ?[place] results found for: HGBA1C, MPG ?[place] results found for: PROLACTIN ?[place] results found for: CHOL, TRIG, HDL, CHOLHDL, VLDL, LDLCALC ?[place] results found for: TSH ? ?Therapeutic Level Labs: ?[place] results found for: LITHIUM ?[place] results found for: VALPROATE ?[place] components found for:  CBMZ ? ?Current Medications: ?Current Outpatient Medications  ?Medication Sig Dispense Refill  ? losartan (COZAAR) 100 MG tablet Take 100 mg by mouth daily.    ? mirtazapine (REMERON) 7.5 MG tablet Take 1 tablet (7.5 mg total) by mouth at bedtime. 90 tablet 0  ? polyethylene glycol powder (MIRALAX) 17 GM/SCOOP powder 17 g daily 850 g 11  ? sevelamer (RENAGEL) 800 MG tablet Take 5,600 mg by mouth 3 (three) times daily with meals. Add 4 tablets by mouth with snacks    ? ?[place] current facility-administered medications for this visit.  ? ? ? ?Musculoskeletal: ?Strength & Muscle Tone: within normal limits ?Gait & Station: normal ?Patient leans: N/A ? ?Psychiatric Specialty Exam: ?Review of Systems  ?Cardiovascular:  Negative for chest pain.  ?Gastrointestinal:  Positive for constipation (chronic). Negative for abdominal pain, diarrhea, nausea and vomiting.  ?Neurological:  Negative for dizziness, weakness and headaches.  ?Psychiatric/Behavioral:  Negative for agitation, hallucinations and sleep disturbance. The patient is not nervous/anxious.    ?There were [place] vitals taken for this visit.There is [place] height or weight on file to calculate BMI.  ?General Appearance: Casual and Fairly Groomed  ?Eye Contact:  Good  ?Speech:  Clear and Coherent, Normal Rate, and spoke Burmese  ?Volume:  Normal  ?Mood:  Euthymic  ?Affect:  Appropriate and Congruent  ?Thought Process:  Coherent and Goal Directed  ?Orientation:  Full (Time, Place, and Person)  ?Thought Content: Logical   ?Suicidal Thoughts:  Peggs   ?Homicidal Thoughts:  Dattilo  ?Memory:  Immediate;   Fair ?Recent;   Fair  ?Judgement:  Good  ?Insight:  Good  ?Psychomotor Activity:  Normal  ?Concentration:  Concentration: Good and Attention Span: Good  ?Recall:  Good  ?Fund of Knowledge: Good  ?Language: Good  ?Akathisia:  Negative  ?Handed:  Right  ?AIMS (if indicated): not done  ?Assets:  Communication Skills ?Desire for Improvement ?Resilience  ?ADL's:  Intact  ?Cognition: WNL  ?Sleep:  Good  ? ?Screenings: ?PHQ2-9   ? ?  [place] Office Visit from 12/02/2020 in [place] Counselor from 11/02/2020 in [facility] Office Visit from 05/27/2019 in [facility] Office Visit from 01/19/2014 in [facility]  ?PHQ-2 Total Score 0 4 0 0  ?PHQ-9 Total Score 2 15 -- --  ? ?  ? ?Flowsheet Row Counselor from 11/02/2020 in [facility]  ?C-SSRS RISK CATEGORY Low Risk  ? ?  ? ? ? ?Assessment and Plan:  ?He has responded well to the Remeron without any issues.  We will not make any medication changes at this time so will begin to space his appointments as he is doing well.  I will see him back in 6-8 weeks. ? ? ? ?MDD, Recurrent, Moderate: ?-Continue Remeron 7.5 mg QHS ? ? ?Collaboration of Care: Collaboration of Care: Case discussed with Supervising Physician Dr. Dwyane Dee. ? ?Patient/Guardian was advised Release of Information must be obtained prior to any record release in order to collaborate their care with an outside provider. Patient/Guardian was advised if they have not already done so to contact the registration department to sign all necessary forms in order for [place] to release information regarding their care.  ? ?Consent: Patient/Guardian gives verbal consent for treatment and assignment of benefits for services provided during this visit. Patient/Guardian expressed understanding and agreed to proceed.  ? ? ?Briant Cedar, MD ?06/09/2021, 5:50  PM ? ?

## 2021-06-16 ENCOUNTER — Ambulatory Visit (INDEPENDENT_AMBULATORY_CARE_PROVIDER_SITE_OTHER): Payer: Medicaid Other | Admitting: Internal Medicine

## 2021-06-16 ENCOUNTER — Encounter: Payer: Self-pay | Admitting: Internal Medicine

## 2021-06-16 VITALS — BP 100/62 | HR 73 | Ht 65.0 in | Wt 125.4 lb

## 2021-06-16 DIAGNOSIS — K5909 Other constipation: Secondary | ICD-10-CM

## 2021-06-16 DIAGNOSIS — N186 End stage renal disease: Secondary | ICD-10-CM

## 2021-06-16 DIAGNOSIS — Z992 Dependence on renal dialysis: Secondary | ICD-10-CM

## 2021-06-16 DIAGNOSIS — Z86010 Personal history of colonic polyps: Secondary | ICD-10-CM

## 2021-06-16 DIAGNOSIS — K2950 Unspecified chronic gastritis without bleeding: Secondary | ICD-10-CM

## 2021-06-16 NOTE — Patient Instructions (Addendum)
We are going to put you on a list to get an EGD & colonoscopy done at [facility]. We will contact you with a date and time. ? ?Glad you are doing better. ? ? ?I appreciate the opportunity to care for you. ?Silvano Rusk, MD, [facility] ?

## 2021-06-16 NOTE — Progress Notes (Signed)
? ?Keith Vaughan 56 y.o. [birth year] [id number] ? ?Assessment & Plan:  ? ?Encounter Diagnoses  ?Name Primary?  ? Constipation, chronic Yes  ? Chronic gastritis without bleeding, unspecified gastritis type   ? Hx of adenomatous polyp of colon   ? ESRD (end stage renal disease) on dialysis [facility])   ? ? ?Constipation is improved on 3 doses of MiraLAX daily.  He will continue. ?He has chronic gastritis with intestinal metaplasia and we will do a follow-up EGD and consider mapping at that time. ?Colonoscopy will be scheduled as well.  He is up-to-date and not really due for surveillance until next year but because of his renal transplant work-up this needs to be moved up.  They need a current procedure, apparently. ? ?He is on dialysis so he needs to have his procedures at the hospital.  We continue to have a nursing shortage at the hospital and I do not have any openings for appointments at this time so we will put him on our list and work him in when appointments become available. ? ?CC: Shary Key, DO ?Dr. Pearson Grippe ? ? ? ?Subjective:  ? ?Chief Complaint: Constipation and follow-up gastritis ? ?HPI ?Patient is a 56 year old Burmese man here for follow-up.  I saw him last month and at that time he was starting MiraLAX.  A prescription was provided it did not help enough so primary care provided an increase in the prescription to 3 doses daily and that is adequately treating his constipation.  He has a history of an adenomatous polyp of the colon and is due for surveillance colonoscopy in 2024 but nephrology is indicating because of a kidney transplant evaluation he needs a more recent colonoscopy. ? ?He also had chronic gastritis with intestinal metaplasia on an EGD in 2021 and I had planned to do a year follow-up exam and when he was here last time I overlooked that so he was brought back to discuss that.  He is not having any upper GI symptoms. ?[place] Known Allergies ?Current Meds  ?Medication Sig  ? losartan  (COZAAR) 100 MG tablet Take 100 mg by mouth daily.  ? mirtazapine (REMERON) 7.5 MG tablet Take 1 tablet (7.5 mg total) by mouth at bedtime.  ? polyethylene glycol powder (MIRALAX) 17 GM/SCOOP powder 17 g daily (Patient taking differently: 51 g daily. 3 doses)  ? ?Past Medical History:  ?Diagnosis Date  ? Acne   ? on bottom  ? Anemia   ? Anxiety   ? at times  ? Depression   ? at times  ? Dialysis patient [facility])   ? Has dialysis MON, WED,FRI.  ? ESRD on dialysis ([place]) 09/30/2013  ? ESRF (end stage renal failure) ([place])   ? Hearing loss   ? Bil hearing aids  ? Hx of adenomatous polyp of colon 06/22/2017  ? Hypertension   ? Irregular heart beat   ? per interpretor/heart beats fast at times/ [place] SOB or chest pain.  ? Pneumonia 2015/2018  ? 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 Renal disorder   ? S/P hemodialysis catheter insertion ([place])   ? Right Internal Jugular   ? 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 ?Past Surgical History:  ?Procedure Laterality Date  ? AV FISTULA PLACEMENT Left 10/01/2013  ? Procedure: RADIOCEPHALIC ARTERIOVENOUS (AV) FISTULA CREATION LEFT ARM;  Surgeon: Mal Misty, MD;  Location: [place];  Service: Vascular;  Laterality: Left;  ? INSERTION OF DIALYSIS CATHETER Right 10/01/2013  ?  Procedure: INSERTION OF DIALYSIS CATHETER- Right Internal Jugular;  Surgeon: Mal Misty, MD;  Location: [facility];  Service: Vascular;  Laterality: Right;  ? leg trauma Bilateral   ? debridement  ? LIPOMA EXCISION Left 2014  ? left thigh lipoma  ? UPPER GASTROINTESTINAL ENDOSCOPY  10/21/2019  ? ?Social History  ? ?Social History Narrative  ? Single, a refugee from [place]. Main language is Burmese. Limited English  ? Lives alone.  ? Christian.  ? Smoker of 2 to 3 cigarettes a day [place] drug use [place] tobacco otherwise [place] alcohol  ? ?family history is not on file. ? ? ?Review of Systems ?As above ? ?Objective:  ? Physical Exam ?BP 100/62   Pulse 73   Ht '5\' 5"'$  (1.651 m)   Wt 125 lb 6.4 oz (56.9 kg)   SpO2 98%    BMI 20.87 kg/m?  ? ? ?

## 2021-08-02 ENCOUNTER — Encounter: Payer: Self-pay | Admitting: Cardiology

## 2021-08-02 ENCOUNTER — Ambulatory Visit (INDEPENDENT_AMBULATORY_CARE_PROVIDER_SITE_OTHER): Payer: Medicaid Other | Admitting: Cardiology

## 2021-08-02 VITALS — BP 130/80 | HR 62 | Ht 65.0 in | Wt 125.6 lb

## 2021-08-02 DIAGNOSIS — Z01810 Encounter for preprocedural cardiovascular examination: Secondary | ICD-10-CM

## 2021-08-02 DIAGNOSIS — Z992 Dependence on renal dialysis: Secondary | ICD-10-CM | POA: Diagnosis not present

## 2021-08-02 DIAGNOSIS — R001 Bradycardia, unspecified: Secondary | ICD-10-CM

## 2021-08-02 DIAGNOSIS — I10 Essential (primary) hypertension: Secondary | ICD-10-CM | POA: Diagnosis not present

## 2021-08-02 DIAGNOSIS — N186 End stage renal disease: Secondary | ICD-10-CM | POA: Diagnosis not present

## 2021-08-02 NOTE — Assessment & Plan Note (Signed)
Heart rate is 62 at baseline.  As such, I do not think that prophylactically starting beta-blocker preoperatively would be warranted even though traditionally would reduce risk. ?

## 2021-08-02 NOTE — Assessment & Plan Note (Signed)
Presence of dialysis itself puts her at increased risk, therefore we are evaluating for ischemia with Myoview stress test, but also check a 2D echocardiogram to better assessment of his EF and valvular function. ?

## 2021-08-02 NOTE — Assessment & Plan Note (Signed)
56 year old gentleman with longstanding ESRD on HD but only other risk factors hypertension.  He denies any cardiac symptoms symptoms of chest pain, pressure or dyspnea with rest or exertion. ?For standard operations, would recommend proceeding to the OR without any cardiac evaluation, however for transplant evaluation, ischemic evaluation is indicated.  Would also like to get a good sense of his EF or wall motion etc. ? ?Plan: ?Myoview Stress Test and Echocardiogram. ?

## 2021-08-02 NOTE — Patient Instructions (Addendum)
Medication Instructions:  ? ? ?*If you need a refill on your cardiac medications before your next appointment, please call your pharmacy* ? ? ?Lab Work: ? ?If you have labs (blood work) drawn today and your tests are completely normal, you will receive your results only by: ?MyChart Message (if you have MyChart) OR ?A paper copy in the mail ?If you have any lab test that is abnormal or we need to change your treatment, we will call you to review the results. ? ? ?Testing/Procedures: ? Will be schedule  at [address] ?Your physician has requested that you have an echocardiogram. Echocardiography is a painless test that uses sound waves to create images of your heart. It provides your doctor with information about the size and shape of your heart and how well your heart?s chambers and valves are working. This procedure takes approximately one hour. There are [place] restrictions for this procedure.  ? ???????????? ???????? echocardiogram ?????? ???????????????? Echocardiography ??? ???????????????????? ????????? ????????????????? ????????? ???????????????? ??????????????????????? ??????? ????????????????????????? ??????????????? ???????????????????????? ????????????? ???????????????????? ???????????????? ????????????? ????????? ????????????????????????????????????????????????????? ????????????????? ?????????????????????? ?sang sararwaank  sangtwin echocardiogram shiraan  taunggsohtarrsai . Echocardiography sai  sang nhaloneponemyarrko  hpaanteeraan  aasan lhainemyarrko aasonepyu  narkyinmhumashisaw  hcamsautmhu taithkuhpyitsai . ?innsai  sang nhalone eat aarwalaahcarrnhang  ponesandarannhang  sang nhalone aahkaannmyarrnhang  a shoetshinmyarr  aalotelote poneaakyaungg aahkyetaalaatmyarrko  sang sararwaanaarr  payyparsai .  i lotengaann hcain sai hkan mhaannhkyay aarr hpyang taitnarre hkan kyaar sai .  i lotengaann hcainaatwat  k n  saat hkyetmyarr mashipar ? ?And ?Will be schedule at [address] [address] ?Your physician has requested that you have a stress myoview. Please follow instruction sheet, as given.  ? ?[address] ???? ????????????????????? ???????????? ???????? ???????????? myoview ?????? ???????????????? ??????????? ????????????????? ?????????????????????? ?????????? ?[address] twin  Database administrator . ? sang sararwaank  sangtwin hcatehpihceemhu myoview shiraan  taunggsohtarrsai . kyaayyjuupyu  payyhtarr sanyaatine  nywhaankyarrhkyethcarrwatko  litenar par ? ? ? ?Follow-Up: ?At [facility], you and your health needs are our priority.  As part of our continuing mission to provide you with exceptional heart care, we have created designated Provider Care Teams.  These Care Teams include your primary Cardiologist (physician) and Advanced Practice Providers (APPs -  Physician Assistants and Nurse Practitioners) who all work together to provide you with the care you need, when you need it. ? ?  ? ?Your next appointment:   ?2 month(s) ? ?The format for your next appointment:   ?In Person ? ?Provider:   ?Glenetta Hew, MD  ?

## 2021-08-02 NOTE — Progress Notes (Signed)
? ? ?Primary Care Provider: Shary Key, DO ?[facility] Cardiologist: Glenetta Hew, MD ?Electrophysiologist: None ?Nephrologist: Dr. Pearson Grippe ?Gastroenterologist: Dr. Silvano Rusk ? ?Clinic Note: ?Chief Complaint  ?Patient presents with  ? New Patient (Initial Visit)  ?  Preoperative cardiovascular evaluation for kidney transplant  ? ?=================================== ? ?ASSESSMENT/PLAN  ? ?Problem List Items Addressed This Visit   ? ?  ? Cardiology Problems  ? Essential hypertension (Chronic)  ?  Pretty well controlled blood pressure on losartan alone.  Question the benefit of an ARB on dialysis patient. ? ?Heart rate 62, therefore I would probably would not use a beta-blocker. ?Evaluate systolic and diastolic function with 2D echo. ? ?  ?  ?  ? Other  ? Bradycardia  ?  Heart rate is 62 at baseline.  As such, I do not think that prophylactically starting beta-blocker preoperatively would be warranted even though traditionally would reduce risk. ? ?  ?  ? Relevant Orders  ? EKG 12-Lead  ? MYOCARDIAL PERFUSION IMAGING  ? ECHOCARDIOGRAM COMPLETE  ? Cardiac Stress Test: Informed Consent Details: Physician/Practitioner Attestation; Transcribe to consent form and obtain patient signature  ? Preoperative cardiovascular examination - Primary  ?  56 year old gentleman with longstanding ESRD on HD but only other risk factors hypertension.  He denies any cardiac symptoms symptoms of chest pain, pressure or dyspnea with rest or exertion. ?For standard operations, would recommend proceeding to the OR without any cardiac evaluation, however for transplant evaluation, ischemic evaluation is indicated.  Would also like to get a good sense of his EF or wall motion etc. ? ?Plan: ?Myoview Stress Test and Echocardiogram. ? ?  ?  ? Relevant Orders  ? EKG 12-Lead  ? MYOCARDIAL PERFUSION IMAGING  ? ECHOCARDIOGRAM COMPLETE  ? Cardiac Stress Test: Informed Consent Details: Physician/Practitioner Attestation; Transcribe to  consent form and obtain patient signature  ? ESRD (end stage renal disease) on dialysis ([place]) (Chronic)  ?  Presence of dialysis itself puts her at increased risk, therefore we are evaluating for ischemia with Myoview stress test, but also check a 2D echocardiogram to better assessment of his EF and valvular function. ? ?  ?  ? Relevant Orders  ? EKG 12-Lead  ? MYOCARDIAL PERFUSION IMAGING  ? ECHOCARDIOGRAM COMPLETE  ? Cardiac Stress Test: Informed Consent Details: Physician/Practitioner Attestation; Transcribe to consent form and obtain patient signature  ? ? ?=================================== ?Burmese interpreter ? ?HPI:   ? ?Keith Vaughan is a 56 y.o. Burmese gentleman with a history of HTN, ESRD on HD (anemia of chronic disease), Chronic Constipation, Major Depressive Disorder and hearing loss who is being seen today for the evaluation of PREOP CARDIOVASCULAR EVALUATION FOR KIDNEY TRANSPLANT at the request of Shary Key, DO. ? ?Rumaldo Brawn was referred on Jul 19, 2021-apparently he requires cardiac clearance for renal transplant surgery.  Referred for cardiology evaluation ? ?Recent Hospitalizations: none ? ?Reviewed  CV studies:   ? ?The following studies were reviewed today: (if available, images/films reviewed: From Epic Chart or Care Everywhere) ?None available.: ? ? ?Interval History:  ? ?Video interpreter: [id number] ? ?Devynn Norville presents here by himself.  We are using a video interpreter.  Unfortunately he does not really speak the main Burmese language as his dialect.  This is a second language.  There are some language barrier with the interpreter as well.  He also has difficulty hearing. ?Essentially he [place] here to the states to [place] in 2015.  He has not been to  work because he ended up going on dialysis shortly after arriving here.  Other than hypertension he really does not have that much with significant past medical history that his renal failure.  He is a light smoker 6 cigarettes a  day for the last 20 years. ? ?He tells me that the only time he noted symptoms that would be suggestive of heart failure such as dyspnea or PND, edema was prior to going on dialysis may benefit heart failure symptoms.  Wollschlager chest pain or pressure, just the dyspnea PND orthopnea.  Since being on dialysis he is never happened again.  He denies any chest pain or pressure at rest or exertion although sometimes if they pull a lot of fluid at dialysis he may feel a little tightness in his chest. ?He is not very active in his day-to-day life or does not necessarily exert himself and bempedoic acid chest pain with exertion, but has Schild resting chest pain.  Debo palpitations. ?Malcolm PND, orthopnea or edema.  Dudzinski syncope/presyncope or TIA's, she gets claudication. ? ?CV Review of Symptoms (Summary) ?Cardiovascular ROS: Sylvan chest pain or dyspnea on exertion ?negative for - edema, irregular heartbeat, orthopnea, palpitations, paroxysmal nocturnal dyspnea, rapid heart rate, shortness of breath, or lightheadedness or dizziness, syncope/near syncope or TIA symptoms are suggestive of claudication. ? ?REVIEWED OF SYSTEMS  ? ?Review of Systems  ?Constitutional:  Negative for malaise/fatigue (Does not have a lot of energy but would describe this fatigue) and weight loss.  ?HENT:  Negative for congestion and nosebleeds.   ?Respiratory:  Negative for cough and shortness of breath.   ?Cardiovascular:  Negative for chest pain and leg swelling (Nonsensical dialysis 8 or 9 years ago.).  ?Musculoskeletal:  Positive for back pain, falls, joint pain and myalgias (Intermittent cramping).  ?Neurological:  Positive for weakness. Negative for focal weakness.  ?Psychiatric/Behavioral:  Negative for depression and memory loss. The patient is nervous/anxious and has insomnia.   ?All other systems reviewed and are negative. ? ? ?  08/02/2021  ? 10:02 PM  ?PAD Screen  ?Previous PAD dx? [place]  ?Previous surgical procedure? Yes  ?Dates of procedures Related to  fistulous.  ESRD  ?Pain with walking? [place]hard  ?Feet/toe relief with dangling? Yono  ?Painful, non-healing ulcers? Esquer  ?Extremities discolored? [place]  ? ? ? ?I have reviewed and (if needed) personally updated the patient's problem list, medications, allergies, past medical and surgical history, social and family history.  ? ?PAST MEDICAL HISTORY  ? ?Past Medical History:  ?Diagnosis Date  ? Acne   ? on bottom  ? Anemia   ? Anxiety   ? at times  ? Depression   ? at times  ? ESRD on dialysis ([employer]) 09/30/2013  ? Monday Wednesday Friday  ? Hearing loss   ? Bil hearing aids  ? Hx of adenomatous polyp of colon 06/22/2017  ? Hypertension   ? Irregular heart beat   ? per interpretor/heart beats fast at times/ Fargnoli SOB or chest pain.  ? Pneumonia 2015/2018  ? S/P hemodialysis catheter insertion ([place])   ? Right Internal Jugular   ? 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 ? ?PAST SURGICAL HISTORY  ? ?Past Surgical History:  ?Procedure Laterality Date  ? AV FISTULA PLACEMENT Left 10/01/2013  ? Procedure: RADIOCEPHALIC ARTERIOVENOUS (AV) FISTULA CREATION LEFT ARM;  Surgeon: Mal Misty, MD;  Location: [facility];  Service: Vascular;  Laterality: Left;  ?  INSERTION OF DIALYSIS CATHETER Right 10/01/2013  ? Procedure: INSERTION OF DIALYSIS CATHETER- Right Internal Jugular;  Surgeon: Mal Misty, MD;  Location: [place];  Service: Vascular;  Laterality: Right;  ? leg trauma Bilateral   ? debridement  ? LIPOMA EXCISION Left 2014  ? left thigh lipoma  ? UPPER GASTROINTESTINAL ENDOSCOPY  10/21/2019  ? ? ?Immunization History  ?Administered Date(s) Administered  ? Hepatitis A, Adult 09/27/2013  ? Hepatitis B, adult 09/27/2013  ? Influenza,inj,Quad PF,6+ Mos 01/19/2014  ? PPD Test 09/26/2013  ? Pneumococcal Conjugate-13 01/19/2014  ? ? ?MEDICATIONS/ALLERGIES  ? ?Current Meds  ?Medication Sig  ? losartan (COZAAR) 100 MG tablet Take 100 mg by mouth daily.  ? mirtazapine (REMERON)  7.5 MG tablet Take 1 tablet (7.5 mg total) by mouth at bedtime.  ? polyethylene glycol powder (MIRALAX) 17 GM/SCOOP powder 17 g daily (Patient taking differently: 51 g daily. 3 doses)  ? VELPHORO 500 MG chewa

## 2021-08-02 NOTE — Assessment & Plan Note (Signed)
Pretty well controlled blood pressure on losartan alone.  Question the benefit of an ARB on dialysis patient. ? ?Heart rate 62, therefore I would probably would not use a beta-blocker. ?Evaluate systolic and diastolic function with 2D echo. ?

## 2021-08-11 ENCOUNTER — Encounter (HOSPITAL_COMMUNITY): Payer: Medicaid Other | Admitting: Student in an Organized Health Care Education/Training Program

## 2021-08-11 ENCOUNTER — Encounter (HOSPITAL_COMMUNITY): Payer: Self-pay

## 2021-08-18 ENCOUNTER — Telehealth: Payer: Self-pay | Admitting: Internal Medicine

## 2021-08-18 NOTE — Telephone Encounter (Signed)
Good Morning Dr. Carlean Purl,  Patients insurance and patient  called wanted to see if patient could be scheduled for a recall colonoscopy. I advised her that patient was not due until 2024. She stated he needs to have procedure done for a kidney transplant. Patient is seeking advice if he can go ahead and have procedure or if he needs to come in the office to discuss with you. Will you please review and advise on scheduling.  Please advise.

## 2021-08-18 NOTE — Telephone Encounter (Signed)
I am aware of this and he is on the waiting list to be scheduled at the hospital.  We have very long waits for this and I have been unable to schedule this he also needs an EGD to take stomach biopsies.  Please find out what days of the week he does dialysis and I will see if I can get it scheduled in the next couple of months.  The other option would be to see if he can get it done at [facility] which is the institution seeing him regarding kidney transplant.

## 2021-08-19 NOTE — Telephone Encounter (Signed)
Dr. Carlean Purl,  Patient has dialysis on Monday, Wednesday, and Friday mornings.

## 2021-08-23 NOTE — Telephone Encounter (Signed)
I left a message with Alinda Dooms RN of the abdominal organ transplant center at [facility] trying to clarify the need for his colonoscopy.  He had one in 2019 and there were 2 polyps removed.  At that time I recommended a repeat in 2024 but the guidelines have changed and I think he could go longer.  Trying to understand if he really needs a colonoscopy now in anticipation of the kidney transplant listing.  I am not sure of that.  I was willing to do it but we have a terrible backlog due to staffing shortages at the hospital.  I also suggested that they consider getting it done at [facility].  I have asked this nurse to call us back.  Patient should also be considered for EGD to follow-up intestinal metaplasia of the stomach.

## 2021-09-05 ENCOUNTER — Telehealth (HOSPITAL_COMMUNITY): Payer: Self-pay

## 2021-09-05 NOTE — Telephone Encounter (Signed)
Spoke with the patient through an interpreter. Instructions given. He stated that he didn't know if someone would be able to come and interpret for him. He stated that he will call back and let us know. S.Jozee Hammer EMTP

## 2021-09-06 ENCOUNTER — Ambulatory Visit (HOSPITAL_COMMUNITY): Payer: Medicaid Other | Attending: Internal Medicine

## 2021-09-06 ENCOUNTER — Ambulatory Visit (HOSPITAL_BASED_OUTPATIENT_CLINIC_OR_DEPARTMENT_OTHER): Payer: Medicaid Other

## 2021-09-06 DIAGNOSIS — Z01810 Encounter for preprocedural cardiovascular examination: Secondary | ICD-10-CM

## 2021-09-06 DIAGNOSIS — R001 Bradycardia, unspecified: Secondary | ICD-10-CM

## 2021-09-06 DIAGNOSIS — Z992 Dependence on renal dialysis: Secondary | ICD-10-CM | POA: Diagnosis not present

## 2021-09-06 DIAGNOSIS — N186 End stage renal disease: Secondary | ICD-10-CM | POA: Diagnosis not present

## 2021-09-06 HISTORY — PX: NM MYOVIEW LTD: HXRAD82

## 2021-09-06 HISTORY — PX: TRANSTHORACIC ECHOCARDIOGRAM: SHX275

## 2021-09-06 LAB — MYOCARDIAL PERFUSION IMAGING
Base ST Depression (mm): 0 mm
LV dias vol: 107 mL (ref 62–150)
LV sys vol: 43 mL
Nuc Stress EF: 60 %
Peak HR: 82 {beats}/min
Rest HR: 65 {beats}/min
Rest Nuclear Isotope Dose: 10.2 mCi
SDS: 5
SRS: 0
SSS: 5
ST Depression (mm): 0 mm
Stress Nuclear Isotope Dose: 32.4 mCi
TID: 0.94

## 2021-09-06 LAB — ECHOCARDIOGRAM COMPLETE
Area-P 1/2: 3.93 cm2
P 1/2 time: 549 msec
S' Lateral: 2.6 cm

## 2021-09-06 MED ORDER — TECHNETIUM TC 99M TETROFOSMIN IV KIT
10.2000 | PACK | Freq: Once | INTRAVENOUS | Status: AC | PRN
  Administered 2021-09-06: 10.2 via INTRAVENOUS

## 2021-09-06 MED ORDER — TECHNETIUM TC 99M TETROFOSMIN IV KIT
32.4000 | PACK | Freq: Once | INTRAVENOUS | Status: AC | PRN
  Administered 2021-09-06: 32.4 via INTRAVENOUS

## 2021-09-06 MED ORDER — REGADENOSON 0.4 MG/5ML IV SOLN
0.4000 mg | Freq: Once | INTRAVENOUS | Status: AC
  Administered 2021-09-06: 0.4 mg via INTRAVENOUS

## 2021-09-08 ENCOUNTER — Telehealth: Payer: Self-pay | Admitting: Internal Medicine

## 2021-09-08 NOTE — Telephone Encounter (Signed)
Reports and results faxed through epic as requested.

## 2021-09-12 ENCOUNTER — Telehealth: Payer: Self-pay | Admitting: Internal Medicine

## 2021-09-13 NOTE — Telephone Encounter (Signed)
Inbound call from patient and interpreter stateing patient is available for procedure date at [facility].

## 2021-09-14 NOTE — Telephone Encounter (Signed)
Nursing staff should book the EGD and colonoscopy and then work on arranging a previsit in person for this patient who requires an interpreter

## 2021-09-19 ENCOUNTER — Other Ambulatory Visit: Payer: Self-pay

## 2021-09-19 DIAGNOSIS — K2970 Gastritis, unspecified, without bleeding: Secondary | ICD-10-CM

## 2021-09-19 DIAGNOSIS — K2950 Unspecified chronic gastritis without bleeding: Secondary | ICD-10-CM

## 2021-09-19 DIAGNOSIS — Z86010 Personal history of colonic polyps: Secondary | ICD-10-CM

## 2021-09-19 NOTE — Telephone Encounter (Signed)
Pt scheduled for an EGD/ Colonoscopy at [facility] on 11/01/2021 at 7:30 AM: Case Number [id number] Through Burmese Interpreter pt was left a message to call back

## 2021-09-21 NOTE — Telephone Encounter (Signed)
Through Burmese Interpreter pt was left a message to call back

## 2021-09-22 NOTE — Telephone Encounter (Signed)
Through a burmese interpreter pt was contacted. Pt was unable to hear in the conversation and repeatedly kept saying "hello"

## 2021-09-26 ENCOUNTER — Telehealth (HOSPITAL_COMMUNITY): Payer: Self-pay | Admitting: *Deleted

## 2021-09-26 DIAGNOSIS — F331 Major depressive disorder, recurrent, moderate: Secondary | ICD-10-CM

## 2021-09-26 NOTE — Telephone Encounter (Signed)
Through Burmese Interpreter pt was left a message to call back in regard to Upper Endoscopy and Colonoscopy:  Pt scheduled for an EGD/ Colonoscopy at [facility] on 11/01/2021 at 7:30 AM: Case Number [id number]: Pt scheduled for a previsit appointment In Person on 10/11/2021 at 10:00 AM:   Pt was sent a my chart message after Multiple Unsuccessful attempts to reach pt by phone:

## 2021-09-26 NOTE — Telephone Encounter (Signed)
Rx Refill Request  mirtazapine (REMERON) 7.5 MG tablet Take 1 tablet (7.5 mg total) by mouth at  bedtime

## 2021-09-27 ENCOUNTER — Encounter: Payer: Self-pay | Admitting: Cardiology

## 2021-09-27 ENCOUNTER — Ambulatory Visit (INDEPENDENT_AMBULATORY_CARE_PROVIDER_SITE_OTHER): Payer: Self-pay | Admitting: Cardiology

## 2021-09-27 VITALS — BP 130/80 | HR 83 | Ht 65.0 in | Wt 124.8 lb

## 2021-09-27 DIAGNOSIS — Z01810 Encounter for preprocedural cardiovascular examination: Secondary | ICD-10-CM

## 2021-09-27 DIAGNOSIS — R001 Bradycardia, unspecified: Secondary | ICD-10-CM

## 2021-09-27 DIAGNOSIS — I10 Essential (primary) hypertension: Secondary | ICD-10-CM

## 2021-09-27 MED ORDER — MIRTAZAPINE 7.5 MG PO TABS
7.5000 mg | ORAL_TABLET | Freq: Every day | ORAL | 0 refills | Status: DC
Start: 2021-09-27 — End: 2021-10-11

## 2021-09-27 NOTE — Progress Notes (Signed)
Primary Care Provider: Shary Key, DO Cardiologist: Glenetta Hew, MD Electrophysiologist: None  Clinic Note: Chief Complaint  Patient presents with   Follow-up    Discussed test results   Pre-op Exam    Echo note results discussed.    ===================================  ASSESSMENT/PLAN   Problem List Items Addressed This Visit       Cardiology Problems   Essential hypertension (Chronic)    Well-controlled BP on amlodipine and losartan.  Resting heart rate when I first saw him was 62, currently 83, but with normal echo and Myoview, Elsbernd indication for beta-blocker..      Relevant Medications   amLODipine (NORVASC) 2.5 MG tablet     Other   Preoperative cardiovascular examination - Primary    As part of standard preoperative evaluation This asymptomatic gentleman, testing would not have been recommended .  However, given the significance of transplant surgery, we did perform 2D echo and Myoview stress test both of which were normal.  At this point Curnow further cardiovascular testing required.  HTN managed by nephrology and PCP. Near active cardiac symptoms.  He is fine to proceed with transplant surgery without any further cardiac evaluation.  LOW RISK from a cardiac standpoint.      Bradycardia (Chronic)    Thankfully, his heart rate today is 83.  Regardless, since he has had some borderline bradycardia, probably would not recommend beta-blocker.       ===================================  HPI:    Keith Vaughan is a 56 y.o. male with a PMH notable for longstanding ESRD-HD (with anemia chronic disease), HTN, MDD, chronic hearing loss and constipation who presents today for 39-monthfollow-up to discuss test results as part of Preop Cardiovascular Risk Assessment for Kidney Transplant.  I saw Keith Vaughan for initial consultation on Aug 02, 2021 at the request of PShary Key DO for preop CV evaluation for potential renal transplant.  Pretty much asymptomatic  from cardiac standpoint besides some fatigue and deconditioning. => To complete the evaluation we ordered a 2D echo and Myoview.  He presents here today to discuss results.  Recent Hospitalizations: None  Reviewed  CV studies:    The following studies were reviewed today: (if available, images/films reviewed: From Epic Chart or Care Everywhere) Echo 09/06/2021:  Left ventricular ejection fraction, by estimation, is 60 to 65%. The left ventricle has normal function. The left ventricle has Klontz regional wall motion abnormalities. There is moderate left ventricular hypertrophy. Left ventricular diastolic parameters were normal. Right ventricular systolic function is normal. The right ventricular size is normal. There is normal pulmonary artery systolic pressure. Cratty evidence of mitral valve regurgitation. The aortic valve is tricuspid. Aortic valve regurgitation is trivial. Aorta Sarver significant ascending aortic/aortic root aneurysm. The inferior vena cava is normal in size with greater  Myoview 09/06/2021   The study is normal. The study is low risk.   Frankowski ST deviation was noted.   LV perfusion is normal. There is Gothard evidence of ischemia. There is Juran evidence of infarction.   Left ventricular function is normal. Nuclear stress EF: 60 %. The left ventricular ejection fraction is normal (55-65%). End diastolic cavity size is normal.   Prior study not available for comparison.  Interval History: Interview conducted with [employer] Interpreter    Keith Vaughan returns here for follow-up to discuss results of his test.  He continues to be doing well with Henigan active cardiac symptoms.  He does not necessarily a very active lifestyle, but is not  sedentary either.  Denies any symptoms of chest tightness /pressure/pain or dyspnea with rest or exertion.  Nield PND, orthopnea, or edema.  Odriscoll rapid irregular heartbeats palpitations.  Mooney syncope/near syncope or TIA/amaurosis fugax symptoms.  Ricke claudication  symptoms.  Pretty stable creatinine point.  REVIEWED OF SYSTEMS   Review of Systems  Constitutional:  Positive for malaise/fatigue (He does note that he gets tired around dialysis days but otherwise not really.). Negative for chills, fever and weight loss.  HENT:  Negative for congestion and nosebleeds.   Respiratory:  Negative for cough and shortness of breath.   Gastrointestinal:  Negative for blood in stool and melena.  Genitourinary:  Negative for hematuria.  Musculoskeletal:  Positive for back pain and joint pain. Negative for falls.  Neurological:  Positive for dizziness (Sometimes after dialysis). Negative for focal weakness.  Psychiatric/Behavioral:  The patient is nervous/anxious.     I have reviewed and (if needed) personally updated the patient's problem list, medications, allergies, past medical and surgical history, social and family history.   PAST MEDICAL HISTORY   Past Medical History:  Diagnosis Date   Acne    on bottom   Anemia    Anxiety    at times   Depression    at times   ESRD on dialysis [facility]) 09/30/2013   Monday Wednesday Friday   Hearing loss    Bil hearing aids   Hx of adenomatous polyp of colon 06/22/2017   Hypertension    Irregular heart beat    per interpretor/heart beats fast at times/ Cadden SOB or chest pain.   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; asymptomatic; => likely due to exposure and not true infection    PAST SURGICAL HISTORY   Past Surgical History:  Procedure Laterality Date   AV FISTULA PLACEMENT Left 10/01/2013   Procedure: RADIOCEPHALIC ARTERIOVENOUS (AV) FISTULA CREATION LEFT ARM;  Surgeon: Mal Misty, MD;  Location: [place];  Service: Vascular;  Laterality: Left;   INSERTION OF DIALYSIS CATHETER Right 10/01/2013   Procedure: INSERTION OF DIALYSIS CATHETER- Right Internal Jugular;  Surgeon: Mal Misty, MD;   Location: [place];  Service: Vascular;  Laterality: Right;   leg trauma Bilateral    debridement   LIPOMA EXCISION Left 2014   left thigh lipoma   NM MYOVIEW LTD  09/06/2021   LOW RISK.  Normal EKG.  Kurt evidence of ischemia or infarction.  EF 60%.   TRANSTHORACIC ECHOCARDIOGRAM  09/06/2021   LVEF 60 to 65%.  Normal function with Eidson RWMA.  Moderate LVH, but normal diastolic parameters.  Normal PAP/RVSP and RAP.  Normal AoV with Hardigree significant Ascending Ao dilation.  Twersky AI.  Mells MR.--Essentially normal echocardiogram   UPPER GASTROINTESTINAL ENDOSCOPY  10/21/2019    Immunization History  Administered Date(s) Administered   Hepatitis A, Adult 09/27/2013   Hepatitis B, adult 09/27/2013   Influenza,inj,Quad PF,6+ Mos 01/19/2014   PPD Test 09/26/2013   Pneumococcal Conjugate-13 01/19/2014    MEDICATIONS/ALLERGIES   Current Meds  Medication Sig   amLODipine (NORVASC) 2.5 MG tablet Take 2.5 mg by mouth at bedtime.   losartan (COZAAR) 100 MG tablet Take 100 mg by mouth daily.   polyethylene glycol powder (MIRALAX) 17 GM/SCOOP powder 17 g daily (Patient taking differently: 51 g daily. 3 doses)   VELPHORO 500 MG chewable tablet Chew 1,000 mg by mouth 3 (three)  times daily.    Lapaglia Known Allergies  SOCIAL HISTORY/FAMILY HISTORY   Reviewed in Epic:  Pertinent findings:  Social History   Tobacco Use   Smoking status: Light Smoker    Types: Cigarettes   Smokeless tobacco: Never   Tobacco comments:    smokes 2-3 cigarettes a day  Vaping Use   Vaping Use: Never used  Substance Use Topics   Alcohol use: Buescher    Alcohol/week: 0.0 standard drinks of alcohol   Drug use: Petronio   Social History   Social History Narrative   Single, a refugee from [place]. Main language is the [place] of Burmese. Limited English   Lives alone.   Christian.   Smoker of 2 to 3 cigarettes a day Dubin drug use Coatney tobacco otherwise Auguste alcohol    OBJCTIVE -PE, EKG, labs   Wt Readings from Last 3  Encounters:  09/27/21 124 lb 12.8 oz (56.6 kg)  08/02/21 125 lb 9.6 oz (57 kg)  06/16/21 125 lb 6.4 oz (56.9 kg)    Physical Exam: BP 130/80 (BP Location: Right Arm)   Pulse 83   Ht '5\' 5"'$  (1.651 m)   Wt 124 lb 12.8 oz (56.6 kg)   SpO2 98%   BMI 20.77 kg/m  Physical Exam Vitals reviewed.  Constitutional:      General: He is not in acute distress.    Appearance: Normal appearance. He is normal weight. He is not ill-appearing.  HENT:     Head: Normocephalic and atraumatic.  Neck:     Vascular: Diedrich carotid bruit (Bruit from fistula noted).  Cardiovascular:     Rate and Rhythm: Normal rate and regular rhythm. Vanecek extrasystoles are present.    Chest Wall: PMI is not displaced.     Pulses: Normal pulses.     Heart sounds: Normal heart sounds, S1 normal and S2 normal. Hamidi murmur (Difficult because of prominent bruit) heard.    Mearns friction rub. Colquhoun gallop.  Pulmonary:     Effort: Pulmonary effort is normal. Stern respiratory distress.     Breath sounds: Normal breath sounds. Horan wheezing, rhonchi or rales.  Musculoskeletal:        General: Barrows swelling. Normal range of motion.     Cervical back: Normal range of motion and neck supple.  Neurological:     General: Boening focal deficit present.     Mental Status: He is alert and oriented to person, place, and time.     Gait: Gait normal.  Psychiatric:        Mood and Affect: Mood normal.        Behavior: Behavior normal.        Thought Content: Thought content normal.        Judgment: Judgment normal.     Adult ECG Report N/a   Recent Labs:  none available   Laroche results found for: "CHOL", "HDL", "LDLCALC", "LDLDIRECT", "TRIG", "CHOLHDL"  Spagnoli results found for: "HGBA1C"; CBC, CHEM Darty results found for: "TSH"  ==================================================  COVID-19 Education: The signs and symptoms of COVID-19 were discussed with the patient and how to seek care for testing (follow up with PCP or arrange E-visit).    I spent a  total of 22 minutes with the patient spent in direct patient consultation.  Additional time spent with chart review  / charting (studies, outside notes, etc): 14 min Total Time: 36 min  Current medicines are reviewed at length with the patient today.  (+/- concerns) N/A  This visit occurred during the SARS-CoV-2 public health emergency.  Safety protocols were in place, including screening questions prior to the visit, additional usage of staff PPE, and extensive cleaning of exam room while observing appropriate contact time as indicated for disinfecting solutions.  Notice: This dictation was prepared with Dragon dictation along with smart phrase technology. Any transcriptional errors that result from this process are unintentional and may not be corrected upon review.  Studies Ordered:   Weems orders of the defined types were placed in this encounter.  Poplaski orders of the defined types were placed in this encounter.   Patient Instructions / Medication Changes & Studies & Tests Ordered   Patient Instructions  Medication Instructions:  Beck changes   *If you need a refill on your cardiac medications before your next appointment, please call your pharmacy*   Lab Work:  Not needed   Testing/Procedures: Not needed   Follow-Up: At [facility], you and your health needs are our priority.  As part of our continuing mission to provide you with exceptional heart care, we have created designated Provider Care Teams.  These Care Teams include your primary Cardiologist (physician) and Advanced Practice Providers (APPs -  Physician Assistants and Nurse Practitioners) who all work together to provide you with the care you need, when you need it.     Your next appointment:   As needed     The format for your next appointment:   In Person  Provider:   Glenetta Hew, MD    Other Instructions   Your physician discussed the hazards of tobacco use. Tobacco use cessation is recommended and  techniques and options to help you quit were discussed.       Glenetta Hew, M.D., M.S. Interventional Cardiologist   Pager # [phone] Phone # [phone] [address]. [place], [place] [address]   Thank you for choosing Heartcare at [facility]!!

## 2021-09-27 NOTE — Telephone Encounter (Addendum)
Received message that patient needed refill of Remeron.  This was sent in to his pharmacy.   Sent Remeron 7.5 mg QHS, 90 tablets with 0 refills.   Fatima Sanger MD Resident

## 2021-09-27 NOTE — Addendum Note (Signed)
Addended by: Briant Cedar on: 09/27/2021 09:50 AM   Modules accepted: Orders

## 2021-09-27 NOTE — Patient Instructions (Signed)
Medication Instructions:  Louvier changes   *If you need a refill on your cardiac medications before your next appointment, please call your pharmacy*   Lab Work:  Not needed   Testing/Procedures: Not needed   Follow-Up: At [facility], you and your health needs are our priority.  As part of our continuing mission to provide you with exceptional heart care, we have created designated Provider Care Teams.  These Care Teams include your primary Cardiologist (physician) and Advanced Practice Providers (APPs -  Physician Assistants and Nurse Practitioners) who all work together to provide you with the care you need, when you need it.     Your next appointment:   As needed     The format for your next appointment:   In Person  Provider:   Glenetta Hew, MD    Other Instructions   Your physician discussed the hazards of tobacco use. Tobacco use cessation is recommended and techniques and options to help you quit were discussed.

## 2021-10-01 ENCOUNTER — Encounter: Payer: Self-pay | Admitting: Cardiology

## 2021-10-01 NOTE — Assessment & Plan Note (Signed)
Well-controlled BP on amlodipine and losartan.  Resting heart rate when I first saw him was 62, currently 83, but with normal echo and Myoview, Sasaki indication for beta-blocker.[name]

## 2021-10-01 NOTE — Assessment & Plan Note (Addendum)
As part of standard preoperative evaluation This asymptomatic gentleman, testing would not have been recommended .  However, given the significance of transplant surgery, we did perform 2D echo and Myoview stress test both of which were normal.  At this point F[place]ree further cardiovascular testing required.  HTN managed by nephrology and PCP. Ketner active cardiac symptoms.  He is fine to proceed with transplant surgery without any further cardiac evaluation.  LOW RISK from a cardiac standpoint.

## 2021-10-01 NOTE — Assessment & Plan Note (Signed)
Thankfully, his heart rate today is 83.  Regardless, since he has had some borderline bradycardia, probably would not recommend beta-blocker.

## 2021-10-11 ENCOUNTER — Ambulatory Visit (AMBULATORY_SURGERY_CENTER): Payer: Medicaid Other

## 2021-10-11 DIAGNOSIS — Z86010 Personal history of colonic polyps: Secondary | ICD-10-CM

## 2021-10-11 DIAGNOSIS — K2950 Unspecified chronic gastritis without bleeding: Secondary | ICD-10-CM

## 2021-10-11 NOTE — Progress Notes (Signed)
Rhine egg or soy allergy known to patient  Schumm issues known to pt with past sedation with any surgeries or procedures Patient denies ever being told they had issues or difficulty with intubation  Schweigert FH of Malignant Hyperthermia Pt is not on diet pills Pt is not on home 02  Pt is not on blood thinners  Pt reports issues with constipation - having to take medications to assist with constipation (Miralax) at least 4-5 times per week- advised per interpreter to continue to use Miralax as he is doing now Ayler A fib or A flutter Have any cardiac testing pending--Kasperski Pt instructed to use Singlecare.com or GoodRx for a price reduction on prep  Interpreter # [id number]=Burmese

## 2021-10-25 ENCOUNTER — Encounter (HOSPITAL_COMMUNITY): Payer: Self-pay | Admitting: Internal Medicine

## 2021-10-31 NOTE — Anesthesia Preprocedure Evaluation (Addendum)
Anesthesia Evaluation  Patient identified by MRN, date of birth, ID band Patient awake    Reviewed: Allergy & Precautions, H&P , NPO status , Patient's Chart, lab work & pertinent test results  Airway Mallampati: I  TM Distance: >3 FB Neck ROM: Full    Dental Luoma notable dental hx. (+) Teeth Intact, Dental Advisory Given   Pulmonary neg pulmonary ROS, Current Smoker,    Pulmonary exam normal breath sounds clear to auscultation       Cardiovascular Exercise Tolerance: Good hypertension, Pt. on medications negative cardio ROS Normal cardiovascular exam Rhythm:Regular Rate:Normal  Echo 6/23 Left ventricular ejection fraction, by estimation, is 60 to 65%. The left ventricle has normal function. The left ventricle has Chargois regional wall motion abnormalities. There is moderate left ventricular hypertrophy. Left ventricular diastolic parameters were normal. 1. Right ventricular systolic function is normal. The right ventricular size is normal. There is normal pulmonary artery systolic pressure. 2. 3. Staffieri evidence of mitral valve regurgitation. 4. The aortic valve is tricuspid. Aortic valve regurgitation is trivial. 5. Aortic Corum significant ascending aortic/aortic root aneurysm.   Neuro/Psych PSYCHIATRIC DISORDERS Anxiety Depression negative neurological ROS  negative psych ROS   GI/Hepatic negative GI ROS, Neg liver ROS,   Endo/Other  negative endocrine ROS  Renal/GU ESRF and DialysisRenal diseasenegative Renal ROS  negative genitourinary   Musculoskeletal negative musculoskeletal ROS (+)   Abdominal   Peds negative pediatric ROS (+)  Hematology negative hematology ROS (+) Blood dyscrasia, anemia ,   Anesthesia Other Findings   Reproductive/Obstetrics negative OB ROS                            Anesthesia Physical Anesthesia Plan  ASA: 3  Anesthesia Plan: MAC   Post-op Pain Management:  Minimal or Brymer pain anticipated   Induction: Intravenous  PONV Risk Score and Plan: 1  Airway Management Planned: Mask and Natural Airway  Additional Equipment: None  Intra-op Plan:   Post-operative Plan:   Informed Consent: I have reviewed the patients History and Physical, chart, labs and discussed the procedure including the risks, benefits and alternatives for the proposed anesthesia with the patient or authorized representative who has indicated his/her understanding and acceptance.       Plan Discussed with: Anesthesiologist and CRNA  Anesthesia Plan Comments:        Anesthesia Quick Evaluation

## 2021-11-01 ENCOUNTER — Ambulatory Visit (HOSPITAL_COMMUNITY): Payer: Medicaid Other | Admitting: Anesthesiology

## 2021-11-01 ENCOUNTER — Ambulatory Visit (HOSPITAL_COMMUNITY)
Admission: RE | Admit: 2021-11-01 | Discharge: 2021-11-01 | Disposition: A | Discharge status: Home / Self Care | Payer: Medicaid Other | Attending: Internal Medicine | Admitting: Internal Medicine

## 2021-11-01 ENCOUNTER — Encounter (HOSPITAL_COMMUNITY)
Admission: RE | Disposition: A | Discharge status: Home / Self Care | Payer: Self-pay | Source: Home / Self Care | Attending: Internal Medicine

## 2021-11-01 ENCOUNTER — Encounter (HOSPITAL_COMMUNITY): Payer: Self-pay | Admitting: Internal Medicine

## 2021-11-01 ENCOUNTER — Ambulatory Visit (HOSPITAL_BASED_OUTPATIENT_CLINIC_OR_DEPARTMENT_OTHER): Payer: Medicaid Other | Admitting: Anesthesiology

## 2021-11-01 DIAGNOSIS — N186 End stage renal disease: Secondary | ICD-10-CM | POA: Diagnosis not present

## 2021-11-01 DIAGNOSIS — K6389 Other specified diseases of intestine: Secondary | ICD-10-CM | POA: Diagnosis not present

## 2021-11-01 DIAGNOSIS — I120 Hypertensive chronic kidney disease with stage 5 chronic kidney disease or end stage renal disease: Secondary | ICD-10-CM | POA: Insufficient documentation

## 2021-11-01 DIAGNOSIS — D125 Benign neoplasm of sigmoid colon: Secondary | ICD-10-CM | POA: Diagnosis not present

## 2021-11-01 DIAGNOSIS — K635 Polyp of colon: Secondary | ICD-10-CM | POA: Diagnosis not present

## 2021-11-01 DIAGNOSIS — Z79899 Other long term (current) drug therapy: Secondary | ICD-10-CM | POA: Insufficient documentation

## 2021-11-01 DIAGNOSIS — K31A11 Gastric intestinal metaplasia without dysplasia, involving the antrum: Secondary | ICD-10-CM | POA: Insufficient documentation

## 2021-11-01 DIAGNOSIS — Z992 Dependence on renal dialysis: Secondary | ICD-10-CM | POA: Diagnosis not present

## 2021-11-01 DIAGNOSIS — K2950 Unspecified chronic gastritis without bleeding: Secondary | ICD-10-CM | POA: Insufficient documentation

## 2021-11-01 DIAGNOSIS — F418 Other specified anxiety disorders: Secondary | ICD-10-CM

## 2021-11-01 DIAGNOSIS — F17200 Nicotine dependence, unspecified, uncomplicated: Secondary | ICD-10-CM | POA: Diagnosis not present

## 2021-11-01 DIAGNOSIS — Z09 Encounter for follow-up examination after completed treatment for conditions other than malignant neoplasm: Secondary | ICD-10-CM

## 2021-11-01 DIAGNOSIS — K2970 Gastritis, unspecified, without bleeding: Secondary | ICD-10-CM

## 2021-11-01 DIAGNOSIS — Z1211 Encounter for screening for malignant neoplasm of colon: Secondary | ICD-10-CM | POA: Insufficient documentation

## 2021-11-01 DIAGNOSIS — Z86010 Personal history of colonic polyps: Secondary | ICD-10-CM | POA: Diagnosis not present

## 2021-11-01 HISTORY — PX: COLONOSCOPY WITH PROPOFOL: SHX5780

## 2021-11-01 HISTORY — PX: ESOPHAGOGASTRODUODENOSCOPY (EGD) WITH PROPOFOL: SHX5813

## 2021-11-01 HISTORY — PX: POLYPECTOMY: SHX5525

## 2021-11-01 HISTORY — PX: BIOPSY: SHX5522

## 2021-11-01 LAB — POCT I-STAT, CHEM 8
BUN: 12 mg/dL (ref 6–20)
Calcium, Ion: 1.26 mmol/L (ref 1.15–1.40)
Chloride: 99 mmol/L (ref 98–111)
Creatinine, Ser: 7.1 mg/dL — ABNORMAL HIGH (ref 0.61–1.24)
Glucose, Bld: 79 mg/dL (ref 70–99)
HCT: 38 % — ABNORMAL LOW (ref 39.0–52.0)
Hemoglobin: 12.9 g/dL — ABNORMAL LOW (ref 13.0–17.0)
Potassium: 4.9 mmol/L (ref 3.5–5.1)
Sodium: 136 mmol/L (ref 135–145)
TCO2: 30 mmol/L (ref 22–32)

## 2021-11-01 SURGERY — ESOPHAGOGASTRODUODENOSCOPY (EGD) WITH PROPOFOL
Anesthesia: Monitor Anesthesia Care

## 2021-11-01 MED ORDER — LIDOCAINE HCL (CARDIAC) PF 100 MG/5ML IV SOSY
PREFILLED_SYRINGE | INTRAVENOUS | Status: DC | PRN
  Administered 2021-11-01: 100 mg via INTRATRACHEAL

## 2021-11-01 MED ORDER — PROPOFOL 500 MG/50ML IV EMUL
INTRAVENOUS | Status: DC | PRN
  Administered 2021-11-01: 75 ug/kg/min via INTRAVENOUS
  Administered 2021-11-01 (×2): 50 mg via INTRAVENOUS

## 2021-11-01 MED ORDER — GLYCOPYRROLATE 0.2 MG/ML IJ SOLN
INTRAMUSCULAR | Status: DC | PRN
  Administered 2021-11-01: .2 mg via INTRAVENOUS

## 2021-11-01 MED ORDER — PROPOFOL 1000 MG/100ML IV EMUL
INTRAVENOUS | Status: AC
  Filled 2021-11-01: qty 100

## 2021-11-01 MED ORDER — SODIUM CHLORIDE 0.9 % IV SOLN: INTRAVENOUS | Status: DC

## 2021-11-01 SURGICAL SUPPLY — 25 items

## 2021-11-01 NOTE — Progress Notes (Signed)
Unable to use ipad interpreter as pt is very hard of hearing and unable to hear interpreter.  Nurse and anesthesia utilized phone app for interpretation

## 2021-11-01 NOTE — Transfer of Care (Signed)
Immediate Anesthesia Transfer of Care Note  Patient: Keith Vaughan  Procedure(s) Performed: ESOPHAGOGASTRODUODENOSCOPY (EGD) WITH PROPOFOL COLONOSCOPY WITH PROPOFOL BIOPSY POLYPECTOMY  Patient Location: PACU  Anesthesia Type:MAC  Level of Consciousness: sedated  Airway & Oxygen Therapy: Patient Spontanous Breathing  Post-op Assessment: Report given to RN  Post vital signs: stable  Last Vitals:  Vitals Value Taken Time  BP 170/85 11/01/21 0848  Temp    Pulse 65 11/01/21 0848  Resp 17 11/01/21 0848  SpO2 100 % 11/01/21 0848  Vitals shown include unvalidated device data.  Last Pain:  Vitals:   11/01/21 0744  TempSrc: Oral         Complications: Dalto notable events documented.

## 2021-11-01 NOTE — Progress Notes (Signed)
Attempted to call pt's driver.  Machuca answer

## 2021-11-01 NOTE — Op Note (Signed)
[facility] Patient Name: Keith Vaughan Procedure Date: 11/01/2021 MRN: [id number] Attending MD: [name] , MD Date of Birth: [birth year] CSN: [id number] Age: 56 Admit Type: Inpatient Procedure:                Colonoscopy Indications:              Surveillance: Personal history of adenomatous                            polyps on last colonoscopy > 3 years ago Providers:                [name], MD, Ladoris Gene, RN, Frazier Richards, Technician Referring MD:              Medicines:                Monitored Anesthesia Care Complications:            Scheid immediate complications. Estimated Blood Loss:     Estimated blood loss was minimal. Procedure:                Pre-Anesthesia Assessment:                           - Prior to the procedure, a History and Physical                            was performed, and patient medications and                            allergies were reviewed. The patient's tolerance of                            previous anesthesia was also reviewed. The risks                            and benefits of the procedure and the sedation                            options and risks were discussed with the patient.                            All questions were answered, and informed consent                            was obtained. Prior Anticoagulants: The patient has                            taken Feagan previous anticoagulant or antiplatelet                            agents. ASA Grade Assessment: III - A patient with  severe systemic disease. After reviewing the risks                            and benefits, the patient was deemed in                            satisfactory condition to undergo the procedure.                           - Prior to the procedure, a History and Physical                            was performed, and patient medications and                            allergies were  reviewed. The patient's tolerance of                            previous anesthesia was also reviewed. The risks                            and benefits of the procedure and the sedation                            options and risks were discussed with the patient.                            All questions were answered, and informed consent                            was obtained. Prior Anticoagulants: The patient has                            taken Greer previous anticoagulant or antiplatelet                            agents. ASA Grade Assessment: III - A patient with                            severe systemic disease. After reviewing the risks                            and benefits, the patient was deemed in                            satisfactory condition to undergo the procedure.                           After obtaining informed consent, the colonoscope                            was passed under direct vision. Throughout the  procedure, the patient's blood pressure, pulse, and                            oxygen saturations were monitored continuously. The                            CF-HQ190L ([id number]) Olympus colonoscope was                            introduced through the anus and advanced to the the                            cecum, identified by appendiceal orifice and                            ileocecal valve. The colonoscopy was performed                            without difficulty. The patient tolerated the                            procedure well. The quality of the bowel                            preparation was good. The bowel preparation used                            was Miralax via split dose instruction. The                            ileocecal valve, appendiceal orifice, and rectum                            were photographed. Scope In: 8:17:26 AM Scope Out: 8:37:23 AM Scope Withdrawal Time: 0 hours 16 minutes 40 seconds  Total Procedure  Duration: 0 hours 19 minutes 57 seconds  Findings:      The perianal and digital rectal examinations were normal.      A diminutive polyp was found in the sigmoid colon. The polyp was       sessile. The polyp was removed with a cold snare. Resection and       retrieval were complete. Verification of patient identification for the       specimen was done. Estimated blood loss was minimal.      A diffuse area of moderately melanotic mucosa was found in the entire       colon.      The exam was otherwise without abnormality on direct and retroflexion       views. Impression:               - One diminutive polyp in the sigmoid colon,                            removed with a cold snare. Resected and retrieved.                           -  Melanotic mucosa in the entire examined colon.                           - The examination was otherwise normal on direct                            and retroflexion views. Moderate Sedation:      Not Applicable - Patient had care per Anesthesia. Recommendation:           - Patient has a contact number available for                            emergencies. The signs and symptoms of potential                            delayed complications were discussed with the                            patient. Return to normal activities tomorrow.                            Written discharge instructions were provided to the                            patient.                           - Resume previous diet.                           - Continue present medications.                           - Await pathology results.                           - Repeat colonoscopy is recommended for                            surveillance. The colonoscopy date will be                            determined after pathology results from today's                            exam become available for review. Procedure Code(s):        --- Professional ---                           [phone],  Colonoscopy, flexible; with removal of                            tumor(s), polyp(s), or other lesion(s) by snare                            technique Diagnosis Code(s):        ---  Professional ---                           Z86.010, Personal history of colonic polyps                           K63.5, Polyp of colon                           K63.89, Other specified diseases of intestine CPT copyright 2019 American Medical Association. All rights reserved. The codes documented in this report are preliminary and upon coder review may  be revised to meet current compliance requirements. [name], MD 11/01/2021 8:58:08 AM This report has been signed electronically. Number of Addenda: 0

## 2021-11-01 NOTE — Op Note (Signed)
[facility] Patient Name: Keith Vaughan Procedure Date: 11/01/2021 MRN: [id number] Attending MD: [name] , MD Date of Birth: [birth year] CSN: [id number] Age: 56 Admit Type: Inpatient Procedure:                Upper GI endoscopy Indications:              Gastritis, Follow-up of gastritis Providers:                [name], MD, Ladoris Gene, RN, Frazier Richards, Technician Referring MD:              Medicines:                Monitored Anesthesia Care Complications:            Allmon immediate complications. Estimated Blood Loss:     Estimated blood loss was minimal. Procedure:                Pre-Anesthesia Assessment:                           - Prior to the procedure, a History and Physical                            was performed, and patient medications and                            allergies were reviewed. The patient's tolerance of                            previous anesthesia was also reviewed. The risks                            and benefits of the procedure and the sedation                            options and risks were discussed with the patient.                            All questions were answered, and informed consent                            was obtained. Prior Anticoagulants: The patient has                            taken Rundquist previous anticoagulant or antiplatelet                            agents. ASA Grade Assessment: III - A patient with                            severe systemic disease. After reviewing the risks  and benefits, the patient was deemed in                            satisfactory condition to undergo the procedure.                           After obtaining informed consent, the endoscope was                            passed under direct vision. Throughout the                            procedure, the patient's blood pressure, pulse, and                            oxygen  saturations were monitored continuously. The                            [id number]-H190 ([id number]) Olympus endoscope was introduced                            through the mouth, and advanced to the second part                            of duodenum. The upper GI endoscopy was                            accomplished without difficulty. The patient                            tolerated the procedure well. Scope In: Scope Out: Findings:      Diffuse inflammation characterized by congestion (edema), erosions and       erythema + pale color patches was found in the entire examined stomach.       Antrum more severe than body. Underberg erosions in body, a few tiny ones in       antrum. Biopsies were taken with a cold forceps for histology.       Verification of patient identification for the specimen was done.       Estimated blood loss was minimal.      The exam was otherwise without abnormality.      The cardia and gastric fundus were normal on retroflexion. Impression:               - Chronic gastritis. Biopsied. See description                            above. mapping biopsies taken given history of                            intestinal metaplasia.                           - The examination was otherwise normal. Moderate Sedation:      Not Applicable - Patient had care per Anesthesia. Recommendation:           -  Patient has a contact number available for                            emergencies. The signs and symptoms of potential                            delayed complications were discussed with the                            patient. Return to normal activities tomorrow.                            Written discharge instructions were provided to the                            patient.                           - Resume previous diet.                           - Continue present medications.                           - Await pathology results.                           - See the other procedure note  for documentation of                            additional recommendations. Colonoscopy next Procedure Code(s):        --- Professional ---                           [phone], Esophagogastroduodenoscopy, flexible,                            transoral; with biopsy, single or multiple Diagnosis Code(s):        --- Professional ---                           K29.50, Unspecified chronic gastritis without                            bleeding                           K29.70, Gastritis, unspecified, without bleeding CPT copyright 2019 American Medical Association. All rights reserved. The codes documented in this report are preliminary and upon coder review may  be revised to meet current compliance requirements. [name], MD 11/01/2021 8:49:27 AM This report has been signed electronically. Number of Addenda: 0

## 2021-11-01 NOTE — Discharge Instructions (Addendum)
The stomach lining was inflamed again. Biopsies taken.   I found and removed one colon polyp - tiny and looks benign. Slyter signs of cancer.  I will let you know pathology results and when to have another routine colonoscopy by mail and/or My Chart.  I appreciate the opportunity to care for you. [name], MD, FACG  YOU HAD AN ENDOSCOPIC PROCEDURE TODAY: Refer to the procedure report and other information in the discharge instructions given to you for any specific questions about what was found during the examination. If this information does not answer your questions, please call Dr. Celesta Aver office at [phone] to clarify.   YOU SHOULD EXPECT: Some feelings of bloating in the abdomen. Passage of more gas than usual. Walking can help get rid of the air that was put into your GI tract during the procedure and reduce the bloating. If you had a lower endoscopy (such as a colonoscopy or flexible sigmoidoscopy) you may notice spotting of blood in your stool or on the toilet paper. Some abdominal soreness may be present for a day or two, also.  DIET: Your first meal following the procedure should be a light meal and then it is ok to progress to your normal diet. A half-sandwich or bowl of soup is an example of a good first meal. Heavy or fried foods are harder to digest and may make you feel nauseous or bloated. Drink plenty of fluids but you should avoid alcoholic beverages for 24 hours.   ACTIVITY: Your care partner should take you home directly after the procedure. You should plan to take it easy, moving slowly for the rest of the day. You can resume normal activity the day after the procedure however YOU SHOULD NOT DRIVE, use power tools, machinery or perform tasks that involve climbing or major physical exertion for 24 hours (because of the sedation medicines used during the test).   SYMPTOMS TO REPORT IMMEDIATELY: A gastroenterologist can be reached at any hour. Please call [phone]   for any of the following symptoms:  Following lower endoscopy (colonoscopy, flexible sigmoidoscopy) Excessive amounts of blood in the stool  Significant tenderness, worsening of abdominal pains  Swelling of the abdomen that is new, acute  Fever of 100 or higher  Following upper endoscopy (EGD, EUS, ERCP, esophageal dilation) Vomiting of blood or coffee ground material  New, significant abdominal pain  New, significant chest pain or pain under the shoulder blades  Painful or persistently difficult swallowing  New shortness of breath  Black, tarry-looking or red, bloody stools  FOLLOW UP:  If any biopsies were taken you will be contacted by phone or by letter within the next 1-3 weeks. Call [phone]  if you have not heard about the biopsies in 3 weeks.  Please also call with any specific questions about appointments or follow up tests.

## 2021-11-01 NOTE — H&P (Signed)
[facility] History and Physical   Primary Care Physician:  Delford Field, FNP   Reason for Procedure:  Gastritis with intestinal metaplasia, history of colon polyps, pretransplant work-up (kidney transplant)  Plan:    EGD and colonoscopy     HPI: Keith Vaughan is a 56 y.o. male here for EGD to follow-up gastritis with intestinal metaplasia and a colonoscopy because of a history of colon polyps.  This is in the setting of a renal transplant work-up.  He has had 4 days of a sharp chest pain lateral to the left nipple.  EKG has not shown any ischemic changes at this point he is tender on the side does not remember any type of injury.  He dialyzed yesterday. Past Medical History:  Diagnosis Date   Acne    on bottom   Anemia    Anxiety    at times   Depression    at times   ESRD on dialysis [facility]) 09/30/2013   Monday Wednesday Friday   Hearing loss    Bil hearing aids   Hx of adenomatous polyp of colon 06/22/2017   Hypertension    on meds   Irregular heart beat    per interpretor/heart beats fast at times/ Franchi SOB or chest pain.   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; asymptomatic; => likely due to exposure and not true infection    Past Surgical History:  Procedure Laterality Date   AV FISTULA PLACEMENT Left 10/01/2013   Procedure: RADIOCEPHALIC ARTERIOVENOUS (AV) FISTULA CREATION LEFT ARM;  Surgeon: Mal Misty, MD;  Location: [facility];  Service: Vascular;  Laterality: Left;   COLONOSCOPY  2019   CG-MAC-mira(good)-TA x 2   INSERTION OF DIALYSIS CATHETER Right 10/01/2013   Procedure: INSERTION OF DIALYSIS CATHETER- Right Internal Jugular;  Surgeon: Mal Misty, MD;  Location: [place];  Service: Vascular;  Laterality: Right;   leg trauma Bilateral    debridement   LIPOMA EXCISION Left 2014   left thigh lipoma   NM MYOVIEW LTD  09/06/2021   LOW RISK.   Normal EKG.  [place] evidence of ischemia or infarction.  EF 60%.   POLYPECTOMY  2019   TA x 2   TRANSTHORACIC ECHOCARDIOGRAM  09/06/2021   LVEF 60 to 65%.  Normal function with Hinners RWMA.  Moderate LVH, but normal diastolic parameters.  Normal PAP/RVSP and RAP.  Normal AoV with Vandermeulen significant Ascending Ao dilation.  Burgen AI.  S[place]er MR.--Essentially normal echocardiogram   UPPER GASTROINTESTINAL ENDOSCOPY  10/21/2019    Prior to Admission medications   Medication Sig Start Date End Date Taking? Authorizing Provider  amLODipine (NORVASC) 2.5 MG tablet Take 2.5 mg by mouth daily.   Yes [provider]  losartan (COZAAR) 100 MG tablet Take 100 mg by mouth daily.   Yes [provider]  polyethylene glycol powder (MIRALAX) 17 GM/SCOOP powder 17 g daily Patient taking differently: Take 17 g by mouth daily. 05/10/21   [name], MD  VELPHORO 500 MG chewable tablet Chew 1,000 mg by mouth 3 (three) times daily. 07/05/21   [provider]    Current Facility-Administered Medications  Medication Dose Route Frequency Provider Last Rate Last Admin   0.9 %  sodium chloride infusion   Intravenous Continuous [name], MD        Allergies as of 09/19/2021   ([place]  Known Allergies)    Family History  Problem Relation Age of Onset   Colon cancer Neg Hx    Esophageal cancer Neg Hx    Rectal cancer Neg Hx    Stomach cancer Neg Hx    CAD Neg Hx    Heart failure Neg Hx    Kidney disease Neg Hx     Social History   Socioeconomic History   Marital status: Single    Spouse name: Not on file   Number of children: Not on file   Years of education: Not on file   Highest education level: Not on file  Occupational History   Not on file  Tobacco Use   Smoking status: Light Smoker    Packs/day: 0.25    Years: 30.00    Total pack years: 7.50    Types: Cigarettes   Smokeless tobacco: Never   Tobacco comments:    smokes 2-3 cigarettes a day  Vaping Use   Vaping Use: Never  used  Substance and Sexual Activity   Alcohol use: Rochette    Alcohol/week: 0.0 standard drinks of alcohol   Drug use: [place]   Sexual activity: Not on file  Other Topics Concern   Not on file  Social History Narrative   Single, a refugee from [place]. Main language is the [place] of [place]. Limited English   Lives alone.   Christian.   Smoker of 2 to 3 cigarettes a day Demetrius drug use Stokke tobacco otherwise Bitting alcohol   Social Determinants of Radio broadcast assistant Strain: Not on file  Food Insecurity: Not on file  Transportation Needs: Not on file  Physical Activity: Not on file  Stress: Not on file  Social Connections: Not on file  Intimate Partner Violence: Not on file    Review of Systems:  All other review of systems negative except as mentioned in the HPI.  Physical Exam: Vital signs BP (!) 210/104   Pulse 60   Temp 98.2 F (36.8 C) (Oral)   Resp 13   SpO2 100%   General:   Alert,  Well-developed, well-nourished, pleasant and cooperative in NAD Lungs:  Clear throughout to auscultation.   Chest wall - tender lateral to left nipple Heart:  Regular rate and rhythm; Ruddell murmurs, clicks, rubs,  or gallops. Abdomen:  Soft, nontender and nondistended. Normal bowel sounds.   Neuro/Psych:  Alert and cooperative. Normal mood and affect. A and O x 3   _0  E. Carlean Purl, MD, [facility] [phone] (pager) 11/01/2021 7:46 AM@

## 2021-11-02 ENCOUNTER — Encounter (HOSPITAL_COMMUNITY): Payer: Self-pay | Admitting: Internal Medicine

## 2021-11-02 LAB — SURGICAL PATHOLOGY

## 2021-11-02 NOTE — Anesthesia Postprocedure Evaluation (Signed)
Anesthesia Post Note  Patient: Keith Vaughan  Procedure(s) Performed: ESOPHAGOGASTRODUODENOSCOPY (EGD) WITH PROPOFOL COLONOSCOPY WITH PROPOFOL BIOPSY POLYPECTOMY     Patient location during evaluation: PACU Anesthesia Type: MAC Level of consciousness: awake and alert Pain management: pain level controlled Vital Signs Assessment: post-procedure vital signs reviewed and stable Respiratory status: spontaneous breathing, nonlabored ventilation, respiratory function stable and patient connected to nasal cannula oxygen Cardiovascular status: stable and blood pressure returned to baseline Postop Assessment: [name] apparent nausea or vomiting Anesthetic complications: [name]   [name] notable events documented.  Last Vitals:  Vitals:   11/01/21 0850 11/01/21 0910  BP: (!) 168/84 (!) 198/103  Pulse: 65 73  Resp: 17 12  Temp:    SpO2: 100% 100%    Last Pain:  Vitals:   11/01/21 0910  TempSrc:   PainSc: Asleep   Pain Goal:                   [name]

## 2021-11-03 ENCOUNTER — Encounter: Payer: Self-pay | Admitting: Internal Medicine

## 2021-12-02 ENCOUNTER — Other Ambulatory Visit: Payer: Self-pay | Admitting: Nephrology

## 2021-12-02 DIAGNOSIS — R1012 Left upper quadrant pain: Secondary | ICD-10-CM

## 2021-12-13 ENCOUNTER — Ambulatory Visit
Admission: RE | Admit: 2021-12-13 | Discharge: 2021-12-13 | Disposition: A | Discharge status: Home / Self Care | Payer: Medicaid Other | Source: Ambulatory Visit | Attending: Nephrology | Admitting: Nephrology

## 2021-12-13 DIAGNOSIS — R1012 Left upper quadrant pain: Secondary | ICD-10-CM

## 2023-04-12 ENCOUNTER — Other Ambulatory Visit: Payer: Self-pay | Admitting: *Deleted

## 2023-04-12 DIAGNOSIS — Z992 Dependence on renal dialysis: Secondary | ICD-10-CM

## 2023-04-18 ENCOUNTER — Ambulatory Visit (HOSPITAL_COMMUNITY)
Admission: RE | Admit: 2023-04-18 | Discharge: 2023-04-18 | Disposition: A | Discharge status: Home / Self Care | Payer: Medicaid Other | Source: Ambulatory Visit | Attending: Vascular Surgery | Admitting: Vascular Surgery

## 2023-04-18 ENCOUNTER — Ambulatory Visit (INDEPENDENT_AMBULATORY_CARE_PROVIDER_SITE_OTHER): Payer: Medicaid Other | Admitting: Physician Assistant

## 2023-04-18 ENCOUNTER — Encounter: Payer: Self-pay | Admitting: Physician Assistant

## 2023-04-18 VITALS — BP 120/83 | HR 72 | Temp 98.3°F | Resp 20 | Ht 65.0 in | Wt 142.8 lb

## 2023-04-18 DIAGNOSIS — Z940 Kidney transplant status: Secondary | ICD-10-CM

## 2023-04-18 DIAGNOSIS — Z992 Dependence on renal dialysis: Secondary | ICD-10-CM | POA: Insufficient documentation

## 2023-04-18 DIAGNOSIS — N186 End stage renal disease: Secondary | ICD-10-CM

## 2023-04-18 NOTE — Progress Notes (Signed)
[facility] [place] HISTORY AND PHYSICAL   History of Present Illness:  Patient is a 58 y.o. year old male who presents for exam of dialysis access with pain after kidney transplant was performed on 09/19/22. He states he has had left forearm pain since his transplant surgery and wants to know if his veins are OK and if they will get smaller now that he is not on HD.  He does heavy lifting for work and also has shoulder pain.  The pain is at night after work.  He points to his shoulder as well when he talks about the pain.  He denies trauma.           Past Medical History:  Diagnosis Date   Acne    on bottom   Anemia    Anxiety    at times   Depression    at times   ESRD on dialysis [facility]) 09/30/2013   Monday Wednesday Friday   Hearing loss    Bil hearing aids   Hx of adenomatous polyp of colon 06/22/2017   Hypertension    on meds   Irregular heart beat    per interpretor/heart beats fast at times/ Venturi SOB or chest pain.   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; CXR negative; asymptomatic; => likely due to exposure and not true infection    Past Surgical History:  Procedure Laterality Date   AV FISTULA PLACEMENT Left 10/01/2013   Procedure: RADIOCEPHALIC ARTERIOVENOUS (AV) FISTULA CREATION LEFT ARM;  Surgeon: Pryor Ochoa, MD;  Location: [facility] OR;  Service: Vascular;  Laterality: Left;   BIOPSY  11/01/2021   Procedure: BIOPSY;  Surgeon: Iva Boop, MD;  Location: [facility] ENDOSCOPY;  Service: Gastroenterology;;   COLONOSCOPY  2019   CG-MAC-mira(good)-TA x 2   COLONOSCOPY WITH PROPOFOL N/A 11/01/2021   Procedure: COLONOSCOPY WITH PROPOFOL;  Surgeon: Iva Boop, MD;  Location: [facility];  Service: Gastroenterology;  Laterality: N/A;   ESOPHAGOGASTRODUODENOSCOPY (EGD) WITH PROPOFOL N/A 11/01/2021   Procedure: ESOPHAGOGASTRODUODENOSCOPY (EGD) WITH PROPOFOL;  Surgeon:  Iva Boop, MD;  Location: [facility]OSCOPY;  Service: Gastroenterology;  Laterality: N/A;   INSERTION OF DIALYSIS CATHETER Right 10/01/2013   Procedure: INSERTION OF DIALYSIS CATHETER- Right Internal Jugular;  Surgeon: Pryor Ochoa, MD;  Location: [facility] OR;  Service: Vascular;  Laterality: Right;   leg trauma Bilateral    debridement   LIPOMA EXCISION Left 2014   left thigh lipoma   NM MYOVIEW LTD  09/06/2021   LOW RISK.  Normal EKG.  Pearlman evidence of ischemia or infarction.  EF 60%.   POLYPECTOMY  2019   TA x 2   POLYPECTOMY  11/01/2021   Procedure: POLYPECTOMY;  Surgeon: Iva Boop, MD;  Location: [facility]OSCOPY;  Service: Gastroenterology;;   TRANSTHORACIC ECHOCARDIOGRAM  09/06/2021   LVEF 60 to 65%.  Normal function with See RWMA.  Moderate LVH, but normal diastolic parameters.  Normal PAP/RVSP and RAP.  Normal AoV with Oelke significant Ascending Ao dilation.  Rempel AI.  Firestine MR.--Essentially normal echocardiogram   UPPER GASTROINTESTINAL ENDOSCOPY  10/21/2019     Social History Social History   Tobacco Use   Smoking status: Light Smoker    Current packs/day: 0.25    Average packs/day: 0.3 packs/day for 30.0 years (7.5 ttl pk-yrs)    Types: Cigarettes   Smokeless tobacco: Never  Tobacco comments:    smokes 2-3 cigarettes a day  Vaping Use   Vaping status: Never Used  Substance Use Topics   Alcohol use: Muma    Alcohol/week: 0.0 standard drinks of alcohol   Drug use: Hinojos    Family History Family History  Problem Relation Age of Onset   Colon cancer Neg Hx    Esophageal cancer Neg Hx    Rectal cancer Neg Hx    Stomach cancer Neg Hx    CAD Neg Hx    Heart failure Neg Hx    Kidney disease Neg Hx     Allergies  Thul Known Allergies   Current Outpatient Medications  Medication Sig Dispense Refill   amLODipine (NORVASC) 5 MG tablet Take 1 tablet by mouth daily.     atorvastatin (LIPITOR) 20 MG tablet Take by mouth.     cinacalcet (SENSIPAR) 30 MG tablet Take by mouth.      Docusate Sodium (DSS) 100 MG CAPS Take by mouth.     famotidine (PEPCID) 20 MG tablet Take 1 tablet by mouth daily.     K Phos Mono-Sod Phos Di & Mono (PHOSPHA 250 NEUTRAL) [phone] MG TABS Take by mouth.     magnesium oxide (MAG-OX) 400 MG tablet Take by mouth.     mycophenolate (MYFORTIC) 180 MG EC tablet Take by mouth.     polyethylene glycol powder (GLYCOLAX/MIRALAX) 17 GM/SCOOP powder Take by mouth.     predniSONE (DELTASONE) 5 MG tablet Take 1 tablet by mouth daily.     sodium bicarbonate 650 MG tablet Take by mouth.     sodium zirconium cyclosilicate (LOKELMA) 10 g PACK packet Take by mouth.     sulfamethoxazole-trimethoprim (BACTRIM) 400-80 MG tablet Take by mouth.     tacrolimus (PROGRAF) 1 MG capsule Take by mouth.     amLODipine (NORVASC) 2.5 MG tablet Take 2.5 mg by mouth daily.     losartan (COZAAR) 100 MG tablet Take 100 mg by mouth daily.     polyethylene glycol powder (MIRALAX) 17 GM/SCOOP powder 17 g daily (Patient taking differently: Take 17 g by mouth daily.) 850 g 11   VELPHORO 500 MG chewable tablet Chew 1,000 mg by mouth 3 (three) times daily.     Betsch current facility-administered medications for this visit.    ROS:   General:  Barrows weight loss, Fever, chills  HEENT: Lara recent headaches, Revoir nasal bleeding, Zundel visual changes, Cashman sore throat  Neurologic: Rarick dizziness, blackouts, seizures. Whittingham recent symptoms of stroke or mini- stroke. Vora recent episodes of slurred speech, or temporary blindness.  Cardiac: Rogoff recent episodes of chest pain/pressure, Kracht shortness of breath at rest.  Forstrom shortness of breath with exertion.  Denies history of atrial fibrillation or irregular heartbeat  Vascular: Vanderslice history of rest pain in feet.  Vasallo history of claudication.  Mitschke history of non-healing ulcer, Haymer history of DVT   Pulmonary: Limbaugh home oxygen, Schnackenberg productive cough, Cedano hemoptysis,  Tellez asthma or wheezing  Musculoskeletal:  [ ]  Arthritis, [ ]  Low back pain,  [ ]  Joint  pain  Hematologic:Hallquist history of hypercoagulable state.  Sigl history of easy bleeding.  Payment history of anemia  Gastrointestinal: Slutsky hematochezia or melena,  Ottey gastroesophageal reflux, Welte trouble swallowing  Urinary: [ ]  chronic Kidney disease, [ ]  on HD - [ ]  MWF or [ ]  TTHS, [ ]  Burning with urination, [ ]  Frequent urination, [ ]  Difficulty urinating; [x] Kidney transplant  Skin: Towry  rashes  Psychological: Tiemann history of anxiety,  Sakurai history of depression   Physical Examination  Vitals:   04/18/23 1223  BP: 120/83  Pulse: 72  Resp: 20  Temp: 98.3 F (36.8 C)  TempSrc: Temporal  SpO2: 98%  Weight: 142 lb 12.8 oz (64.8 kg)  Height: 5\' 5"  (1.651 m)    Body mass index is 23.76 kg/m.  General:  Alert and oriented, Hilbert acute distress HEENT: Normal Neck: Hao bruit or JVD Pulmonary: Clear to auscultation bilaterally Cardiac: Regular Rate and Rhythm without murmur Gastrointestinal: Soft, non-tender, non-distended, Vallin mass, Sardinas scars Skin: Hanigan rash Extremity Pulses:  2+ radial, brachial pulses bilaterally, palpable thrill in the fistula.  The fistula is aneurismal with healthy skin appearance.   Musculoskeletal: Nanez deformity or edema  minimal Pain response with grip testing, internal/external shoulder rotation   DATA:     Findings:  +--------------------+----------+-----------------+--------+  AVF                PSV (cm/s)Flow Vol (mL/min)Comments  +--------------------+----------+-----------------+--------+  Native artery inflow   212           701                 +--------------------+----------+-----------------+--------+  AVF Anastomosis        150                               +--------------------+----------+-----------------+--------+     +----------------+----------+-------------+----------+---------------------  -+  OUTFLOW VEIN    PSV (cm/s)Diameter (cm)Depth (cm)       Describe           +----------------+----------+-------------+----------+---------------------  -+  proximal forearm    70        1.06        0.50                            +----------------+----------+-------------+----------+---------------------  -+  mid Forearm         54        0.86        0.48                            +----------------+----------+-------------+----------+---------------------  -+  distal Forearm      22        1.71        0.24                            +----------------+----------+-------------+----------+---------------------  -+  wrist             130        1.18        0.47   Non occlusive  thrombus  +----------------+----------+-------------+----------+---------------------  -+     +-----------------+-------------+---------+---------+----------+-----------  ----+                  Diameter (cm)  Depth  BranchingPSV (cm/s)  Flow  Volume                                    (cm)                          (ml/min)      +-----------------+-------------+---------+---------+----------+-----------  ----+  Radial artery                                       75                      distal to                                                                   anastomosis                                                                 +-----------------+-------------+---------+---------+----------+-----------  ----+  with AVF                                            43                      compression                                                                 +-----------------+-------------+---------+---------+----------+-----------  ----+     Summary:  Patent AVF.  Some chronic thrombus seen.  Outflow vein is aneurysmal.  Radial artery is tortuous.  The distal radial artery appears retrograde and low resisitant. It remains  retrograde with AVF compression however the waveform is high  resisitant  and low velocity.   ASSESSMENT/PLAN: Patent left forearm aneurysmal AV fistula.  Ballard sign orf erythema or surrounding edema.   On exam he has shoulder pain with int/ext rotation.  He has a palpable radial pulse.  I explained I did not feel like the fistula was the cause of discomfort since it has not been accessed in a while.  There is a good palpable thrill and a good radial pulse.  Jernberg muscle waisting in the hand.  He does have shoulder pain and lift heavy objects at work.  If we had to remove the fistula he could potentially have more pain issues due to the fact we would heave a large incision down the whole forearm.    He asked if he has increased pain should he call back and I said yes, but I would recommend f/u with an orthopedic to check on his shoulder for possible rotator cuff issue with radicular pain first.  F/U PRN.        Mosetta Pigeon PA-C [facility] [place] Office: [phone]  MD in clinic [place]
